# Patient Record
Sex: Female | Born: 1950 | Race: White | Hispanic: No | Marital: Single | Smoking: Current every day smoker
Health system: Southern US, Community
[De-identification: ages and names within clinical notes are randomized; demographics above are authoritative.]

## PROBLEM LIST (undated history)

## (undated) DIAGNOSIS — R112 Nausea with vomiting, unspecified: Secondary | ICD-10-CM

## (undated) DIAGNOSIS — F32A Depression, unspecified: Secondary | ICD-10-CM

## (undated) DIAGNOSIS — K219 Gastro-esophageal reflux disease without esophagitis: Secondary | ICD-10-CM

## (undated) DIAGNOSIS — D649 Anemia, unspecified: Secondary | ICD-10-CM

## (undated) DIAGNOSIS — G43909 Migraine, unspecified, not intractable, without status migrainosus: Secondary | ICD-10-CM

## (undated) DIAGNOSIS — R351 Nocturia: Secondary | ICD-10-CM

## (undated) DIAGNOSIS — M1990 Unspecified osteoarthritis, unspecified site: Secondary | ICD-10-CM

## (undated) DIAGNOSIS — Z9889 Other specified postprocedural states: Secondary | ICD-10-CM

## (undated) DIAGNOSIS — I10 Essential (primary) hypertension: Secondary | ICD-10-CM

## (undated) DIAGNOSIS — M4800 Spinal stenosis, site unspecified: Secondary | ICD-10-CM

## (undated) DIAGNOSIS — F329 Major depressive disorder, single episode, unspecified: Secondary | ICD-10-CM

## (undated) DIAGNOSIS — Z973 Presence of spectacles and contact lenses: Secondary | ICD-10-CM

## (undated) DIAGNOSIS — J189 Pneumonia, unspecified organism: Secondary | ICD-10-CM

## (undated) HISTORY — PX: COLONOSCOPY W/ BIOPSIES AND POLYPECTOMY: SHX1376

## (undated) HISTORY — PX: LUMBAR FUSION: SHX111

## (undated) HISTORY — PX: ECTOPIC PREGNANCY SURGERY: SHX613

## (undated) HISTORY — PX: DILATION AND CURETTAGE OF UTERUS: SHX78

## (undated) HISTORY — PX: HAMMER TOE SURGERY: SHX385

## (undated) HISTORY — PX: BUNIONECTOMY: SHX129

## (undated) HISTORY — PX: CARPAL TUNNEL RELEASE: SHX101

---

## 1999-07-07 HISTORY — PX: BACK SURGERY: SHX140

## 2009-09-04 ENCOUNTER — Encounter: Admission: RE | Admit: 2009-09-04 | Discharge: 2009-09-04 | Payer: Self-pay | Admitting: Gastroenterology

## 2010-05-09 ENCOUNTER — Emergency Department (HOSPITAL_BASED_OUTPATIENT_CLINIC_OR_DEPARTMENT_OTHER): Admission: EM | Admit: 2010-05-09 | Discharge: 2010-05-09 | Payer: Self-pay | Admitting: Emergency Medicine

## 2010-05-09 ENCOUNTER — Ambulatory Visit: Payer: Self-pay | Admitting: Diagnostic Radiology

## 2011-01-23 ENCOUNTER — Emergency Department (HOSPITAL_COMMUNITY): Payer: BC Managed Care – PPO

## 2011-01-23 ENCOUNTER — Emergency Department (HOSPITAL_COMMUNITY)
Admission: EM | Admit: 2011-01-23 | Discharge: 2011-01-24 | Disposition: A | Discharge status: Home / Self Care | Payer: BC Managed Care – PPO | Attending: Emergency Medicine | Admitting: Emergency Medicine

## 2011-01-23 DIAGNOSIS — J3489 Other specified disorders of nose and nasal sinuses: Secondary | ICD-10-CM | POA: Insufficient documentation

## 2011-01-23 DIAGNOSIS — R112 Nausea with vomiting, unspecified: Secondary | ICD-10-CM | POA: Insufficient documentation

## 2011-01-23 DIAGNOSIS — J329 Chronic sinusitis, unspecified: Secondary | ICD-10-CM | POA: Insufficient documentation

## 2011-01-23 DIAGNOSIS — R51 Headache: Secondary | ICD-10-CM | POA: Insufficient documentation

## 2011-01-23 DIAGNOSIS — R42 Dizziness and giddiness: Secondary | ICD-10-CM | POA: Insufficient documentation

## 2011-04-29 ENCOUNTER — Ambulatory Visit (HOSPITAL_COMMUNITY)
Admission: RE | Admit: 2011-04-29 | Discharge: 2011-04-29 | Disposition: A | Discharge status: Home / Self Care | Payer: BC Managed Care – PPO | Source: Ambulatory Visit | Attending: Family Medicine | Admitting: Family Medicine

## 2011-04-29 DIAGNOSIS — M79609 Pain in unspecified limb: Secondary | ICD-10-CM | POA: Insufficient documentation

## 2011-05-26 ENCOUNTER — Other Ambulatory Visit: Payer: Self-pay | Admitting: Neurosurgery

## 2011-05-26 DIAGNOSIS — M549 Dorsalgia, unspecified: Secondary | ICD-10-CM

## 2011-05-26 DIAGNOSIS — M5410 Radiculopathy, site unspecified: Secondary | ICD-10-CM

## 2011-06-02 ENCOUNTER — Ambulatory Visit
Admission: RE | Admit: 2011-06-02 | Discharge: 2011-06-02 | Disposition: A | Discharge status: Home / Self Care | Payer: BC Managed Care – PPO | Source: Ambulatory Visit | Attending: Neurosurgery | Admitting: Neurosurgery

## 2011-06-02 DIAGNOSIS — M5410 Radiculopathy, site unspecified: Secondary | ICD-10-CM

## 2011-06-02 DIAGNOSIS — M549 Dorsalgia, unspecified: Secondary | ICD-10-CM

## 2011-06-02 MED ORDER — ONDANSETRON HCL 4 MG/2ML IJ SOLN
4.0000 mg | Freq: Once | INTRAMUSCULAR | Status: AC
  Administered 2011-06-02: 4 mg via INTRAMUSCULAR

## 2011-06-02 MED ORDER — MEPERIDINE HCL 100 MG/ML IJ SOLN
75.0000 mg | Freq: Once | INTRAMUSCULAR | Status: AC
  Administered 2011-06-02: 75 mg via INTRAMUSCULAR

## 2011-06-02 MED ORDER — IOHEXOL 180 MG/ML  SOLN
20.0000 mL | Freq: Once | INTRAMUSCULAR | Status: AC | PRN
  Administered 2011-06-02: 20 mL via INTRATHECAL

## 2011-06-02 MED ORDER — DIAZEPAM 5 MG PO TABS
10.0000 mg | ORAL_TABLET | Freq: Once | ORAL | Status: AC
Start: 2011-06-02 — End: 2011-06-02
  Administered 2011-06-02: 10 mg via ORAL

## 2011-06-02 NOTE — Progress Notes (Signed)
Resting quietly in nursing station with friend at bedside.  "That pain shot is kicking in."  jkl

## 2011-06-02 NOTE — Patient Instructions (Signed)

## 2011-06-25 ENCOUNTER — Other Ambulatory Visit: Payer: Self-pay | Admitting: Neurosurgery

## 2011-07-01 ENCOUNTER — Encounter (HOSPITAL_COMMUNITY): Payer: Self-pay

## 2011-07-13 ENCOUNTER — Ambulatory Visit (HOSPITAL_COMMUNITY)
Admission: RE | Admit: 2011-07-13 | Discharge: 2011-07-13 | Disposition: A | Discharge status: Home / Self Care | Payer: BC Managed Care – PPO | Source: Ambulatory Visit | Attending: Anesthesiology | Admitting: Anesthesiology

## 2011-07-13 ENCOUNTER — Encounter (HOSPITAL_COMMUNITY)
Admission: RE | Admit: 2011-07-13 | Discharge: 2011-07-13 | Disposition: A | Discharge status: Home / Self Care | Payer: BC Managed Care – PPO | Source: Ambulatory Visit | Attending: Neurosurgery | Admitting: Neurosurgery

## 2011-07-13 ENCOUNTER — Encounter (HOSPITAL_COMMUNITY): Payer: Self-pay

## 2011-07-13 ENCOUNTER — Other Ambulatory Visit: Payer: Self-pay

## 2011-07-13 DIAGNOSIS — Z01818 Encounter for other preprocedural examination: Secondary | ICD-10-CM | POA: Insufficient documentation

## 2011-07-13 DIAGNOSIS — M48061 Spinal stenosis, lumbar region without neurogenic claudication: Secondary | ICD-10-CM | POA: Insufficient documentation

## 2011-07-13 DIAGNOSIS — Z01812 Encounter for preprocedural laboratory examination: Secondary | ICD-10-CM | POA: Insufficient documentation

## 2011-07-13 HISTORY — DX: Migraine, unspecified, not intractable, without status migrainosus: G43.909

## 2011-07-13 HISTORY — DX: Essential (primary) hypertension: I10

## 2011-07-13 HISTORY — DX: Spinal stenosis, site unspecified: M48.00

## 2011-07-13 HISTORY — DX: Major depressive disorder, single episode, unspecified: F32.9

## 2011-07-13 HISTORY — DX: Nocturia: R35.1

## 2011-07-13 HISTORY — DX: Depression, unspecified: F32.A

## 2011-07-13 HISTORY — DX: Unspecified osteoarthritis, unspecified site: M19.90

## 2011-07-13 LAB — COMPREHENSIVE METABOLIC PANEL
AST: 22 U/L (ref 0–37)
Albumin: 4.4 g/dL (ref 3.5–5.2)
CO2: 27 mEq/L (ref 19–32)
Calcium: 10.3 mg/dL (ref 8.4–10.5)
Creatinine, Ser: 0.71 mg/dL (ref 0.50–1.10)
GFR calc non Af Amer: >90 mL/min (ref >90)

## 2011-07-13 LAB — CBC
Hemoglobin: 14 g/dL (ref 12.0–15.0)
MCH: 33.1 pg (ref 26.0–34.0)
RBC: 4.23 MIL/uL (ref 3.87–5.11)
WBC: 8.4 10*3/uL (ref 4.0–10.5)

## 2011-07-13 LAB — SURGICAL PCR SCREEN
MRSA, PCR: NEGATIVE
Staphylococcus aureus: NEGATIVE

## 2011-07-13 NOTE — Pre-Procedure Instructions (Signed)
20 DEBAR PLATE  07/13/2011   Your procedure is scheduled on:  July 15, 2011  Report to [facility] at 0700 AM.  Call this number if you have problems the morning of surgery: [phone]   Remember:   Do not eat food:After Midnight.  May have clear liquids: up to 4 Hours before arrival.  Clear liquids include soda, tea, black coffee, apple or grape juice, broth.  Take these medicines the morning of surgery with A SIP OF WATER: Zyrtec, eye drop, Effexor   Do not wear jewelry, make-up or nail polish.  Do not wear lotions, powders, or perfumes. You may wear deodorant.  Do not shave 48 hours prior to surgery.  Do not bring valuables to the hospital.  Contacts, dentures or bridgework may not be worn into surgery.  Leave suitcase in the car. After surgery it may be brought to your room.  For patients admitted to the hospital, checkout time is 11:00 AM the day of discharge.   Patients discharged the day of surgery will not be allowed to drive home.  Special Instructions: CHG Shower Use Special Wash: 1/2 bottle night before surgery and 1/2 bottle morning of surgery.   Please read over the following fact sheets that you were given: Pain Booklet, Coughing and Deep Breathing and Surgical Site Infection Prevention

## 2011-07-14 MED ORDER — CEFAZOLIN SODIUM 1-5 GM-% IV SOLN
1.0000 g | INTRAVENOUS | Status: AC
  Administered 2011-07-15: 1 g via INTRAVENOUS
  Filled 2011-07-14: qty 50

## 2011-07-15 ENCOUNTER — Encounter (HOSPITAL_COMMUNITY)
Admission: RE | Disposition: A | Discharge status: Home / Self Care | Payer: Self-pay | Source: Ambulatory Visit | Attending: Neurosurgery

## 2011-07-15 ENCOUNTER — Encounter (HOSPITAL_COMMUNITY): Payer: Self-pay | Admitting: *Deleted

## 2011-07-15 ENCOUNTER — Ambulatory Visit (HOSPITAL_COMMUNITY)
Admission: RE | Admit: 2011-07-15 | Discharge: 2011-07-17 | DRG: 758 | Disposition: A | Discharge status: Home / Self Care | Payer: BC Managed Care – PPO | Source: Ambulatory Visit | Attending: Neurosurgery | Admitting: Neurosurgery

## 2011-07-15 ENCOUNTER — Ambulatory Visit (HOSPITAL_COMMUNITY): Payer: BC Managed Care – PPO | Admitting: Anesthesiology

## 2011-07-15 ENCOUNTER — Encounter (HOSPITAL_COMMUNITY): Payer: Self-pay | Admitting: Anesthesiology

## 2011-07-15 ENCOUNTER — Ambulatory Visit (HOSPITAL_COMMUNITY): Payer: BC Managed Care – PPO

## 2011-07-15 DIAGNOSIS — F17200 Nicotine dependence, unspecified, uncomplicated: Secondary | ICD-10-CM | POA: Insufficient documentation

## 2011-07-15 DIAGNOSIS — Z981 Arthrodesis status: Secondary | ICD-10-CM | POA: Insufficient documentation

## 2011-07-15 DIAGNOSIS — M48061 Spinal stenosis, lumbar region without neurogenic claudication: Secondary | ICD-10-CM | POA: Insufficient documentation

## 2011-07-15 DIAGNOSIS — R351 Nocturia: Secondary | ICD-10-CM | POA: Insufficient documentation

## 2011-07-15 DIAGNOSIS — R51 Headache: Secondary | ICD-10-CM | POA: Insufficient documentation

## 2011-07-15 DIAGNOSIS — F3289 Other specified depressive episodes: Secondary | ICD-10-CM | POA: Insufficient documentation

## 2011-07-15 DIAGNOSIS — F329 Major depressive disorder, single episode, unspecified: Secondary | ICD-10-CM | POA: Insufficient documentation

## 2011-07-15 DIAGNOSIS — I10 Essential (primary) hypertension: Secondary | ICD-10-CM | POA: Insufficient documentation

## 2011-07-15 DIAGNOSIS — K649 Unspecified hemorrhoids: Secondary | ICD-10-CM | POA: Insufficient documentation

## 2011-07-15 HISTORY — PX: LUMBAR LAMINECTOMY/DECOMPRESSION MICRODISCECTOMY: SHX5026

## 2011-07-15 SURGERY — LUMBAR LAMINECTOMY/DECOMPRESSION MICRODISCECTOMY
Anesthesia: General | Site: Back | Wound class: Clean

## 2011-07-15 MED ORDER — CEFAZOLIN SODIUM 1-5 GM-% IV SOLN
1.0000 g | Freq: Three times a day (TID) | INTRAVENOUS | Status: AC
  Administered 2011-07-15 – 2011-07-16 (×2): 1 g via INTRAVENOUS
  Filled 2011-07-15 (×3): qty 50

## 2011-07-15 MED ORDER — SODIUM CHLORIDE 0.9 % IJ SOLN: 3.0000 mL | INTRAMUSCULAR | Status: DC | PRN

## 2011-07-15 MED ORDER — PROPOFOL 10 MG/ML IV EMUL
INTRAVENOUS | Status: DC | PRN
  Administered 2011-07-15: 200 mg via INTRAVENOUS

## 2011-07-15 MED ORDER — FENTANYL CITRATE 0.05 MG/ML IJ SOLN
INTRAMUSCULAR | Status: DC | PRN
  Administered 2011-07-15 (×2): 50 ug via INTRAVENOUS
  Administered 2011-07-15: 100 ug via INTRAVENOUS
  Administered 2011-07-15 (×2): 50 ug via INTRAVENOUS

## 2011-07-15 MED ORDER — LORATADINE 10 MG PO TABS
10.0000 mg | ORAL_TABLET | Freq: Every day | ORAL | Status: DC
  Administered 2011-07-16 – 2011-07-17 (×2): 10 mg via ORAL
  Filled 2011-07-15 (×3): qty 1

## 2011-07-15 MED ORDER — SODIUM CHLORIDE 0.9 % IV SOLN: 250.0000 mL | INTRAVENOUS | Status: DC

## 2011-07-15 MED ORDER — EPHEDRINE SULFATE 50 MG/ML IJ SOLN
INTRAMUSCULAR | Status: DC | PRN
  Administered 2011-07-15: 10 mg via INTRAVENOUS
  Administered 2011-07-15 (×2): 5 mg via INTRAVENOUS
  Administered 2011-07-15 (×2): 10 mg via INTRAVENOUS

## 2011-07-15 MED ORDER — METOPROLOL SUCCINATE ER 50 MG PO TB24
50.0000 mg | ORAL_TABLET | Freq: Every day | ORAL | Status: DC
  Administered 2011-07-15 – 2011-07-17 (×3): 50 mg via ORAL
  Filled 2011-07-15 (×3): qty 1

## 2011-07-15 MED ORDER — ZOLPIDEM TARTRATE 5 MG PO TABS: 10.0000 mg | ORAL_TABLET | Freq: Every evening | ORAL | Status: DC | PRN

## 2011-07-15 MED ORDER — MIDAZOLAM HCL 5 MG/5ML IJ SOLN
INTRAMUSCULAR | Status: DC | PRN
  Administered 2011-07-15: 2 mg via INTRAVENOUS

## 2011-07-15 MED ORDER — THROMBIN 5000 UNITS EX SOLR
OROMUCOSAL | Status: DC | PRN
  Administered 2011-07-15: 10:00:00 via TOPICAL

## 2011-07-15 MED ORDER — ROCURONIUM BROMIDE 100 MG/10ML IV SOLN
INTRAVENOUS | Status: DC | PRN
  Administered 2011-07-15: 10 mg via INTRAVENOUS
  Administered 2011-07-15: 40 mg via INTRAVENOUS

## 2011-07-15 MED ORDER — DIAZEPAM 5 MG/ML IJ SOLN
INTRAMUSCULAR | Status: AC
  Administered 2011-07-15: 5 mg via INTRAVENOUS
  Filled 2011-07-15: qty 2

## 2011-07-15 MED ORDER — DIAZEPAM 5 MG/ML PO CONC: 5.0000 mg | Freq: Four times a day (QID) | ORAL | Status: DC | PRN

## 2011-07-15 MED ORDER — LIDOCAINE HCL (CARDIAC) 20 MG/ML IV SOLN
INTRAVENOUS | Status: DC | PRN
  Administered 2011-07-15: 100 mg via INTRAVENOUS

## 2011-07-15 MED ORDER — VENLAFAXINE HCL ER 37.5 MG PO CP24
37.5000 mg | ORAL_CAPSULE | Freq: Every day | ORAL | Status: DC
  Administered 2011-07-16 – 2011-07-17 (×2): 37.5 mg via ORAL
  Filled 2011-07-15 (×3): qty 1

## 2011-07-15 MED ORDER — SODIUM CHLORIDE 0.9 % IJ SOLN
3.0000 mL | Freq: Two times a day (BID) | INTRAMUSCULAR | Status: DC
  Administered 2011-07-15: 3 mL via INTRAVENOUS

## 2011-07-15 MED ORDER — GLYCOPYRROLATE 0.2 MG/ML IJ SOLN
INTRAMUSCULAR | Status: DC | PRN
  Administered 2011-07-15: .4 mg via INTRAVENOUS

## 2011-07-15 MED ORDER — MENTHOL 3 MG MT LOZG
1.0000 | LOZENGE | OROMUCOSAL | Status: DC | PRN
  Filled 2011-07-15: qty 9

## 2011-07-15 MED ORDER — SODIUM CHLORIDE 0.9 % IV SOLN
INTRAVENOUS | Status: DC
  Administered 2011-07-15: 1000 mL via INTRAVENOUS

## 2011-07-15 MED ORDER — PHENOL 1.4 % MT LIQD: 1.0000 | OROMUCOSAL | Status: DC | PRN

## 2011-07-15 MED ORDER — MORPHINE SULFATE 4 MG/ML IJ SOLN
INTRAMUSCULAR | Status: AC
  Filled 2011-07-15: qty 1

## 2011-07-15 MED ORDER — LACTATED RINGERS IV SOLN
INTRAVENOUS | Status: DC | PRN
  Administered 2011-07-15 (×2): via INTRAVENOUS

## 2011-07-15 MED ORDER — BACITRACIN 50000 UNITS IM SOLR
INTRAMUSCULAR | Status: AC
  Filled 2011-07-15: qty 50000

## 2011-07-15 MED ORDER — HYDROMORPHONE HCL PF 1 MG/ML IJ SOLN
0.2500 mg | INTRAMUSCULAR | Status: DC | PRN
  Administered 2011-07-15 (×2): 0.5 mg via INTRAVENOUS

## 2011-07-15 MED ORDER — DROPERIDOL 2.5 MG/ML IJ SOLN: 0.6250 mg | INTRAMUSCULAR | Status: DC | PRN

## 2011-07-15 MED ORDER — ONDANSETRON HCL 4 MG/2ML IJ SOLN: 4.0000 mg | INTRAMUSCULAR | Status: DC | PRN

## 2011-07-15 MED ORDER — ONDANSETRON HCL 4 MG/2ML IJ SOLN
INTRAMUSCULAR | Status: DC | PRN
  Administered 2011-07-15: 4 mg via INTRAVENOUS

## 2011-07-15 MED ORDER — HYDROMORPHONE HCL PF 1 MG/ML IJ SOLN
INTRAMUSCULAR | Status: AC
  Filled 2011-07-15: qty 1

## 2011-07-15 MED ORDER — NEOSTIGMINE METHYLSULFATE 1 MG/ML IJ SOLN
INTRAMUSCULAR | Status: DC | PRN
  Administered 2011-07-15: 3 mg via INTRAVENOUS

## 2011-07-15 MED ORDER — ACETAMINOPHEN 650 MG RE SUPP: 650.0000 mg | RECTAL | Status: DC | PRN

## 2011-07-15 MED ORDER — SODIUM CHLORIDE 0.9 % IV SOLN
INTRAVENOUS | Status: AC
  Filled 2011-07-15: qty 500

## 2011-07-15 MED ORDER — OXYCODONE-ACETAMINOPHEN 5-325 MG PO TABS
1.0000 | ORAL_TABLET | ORAL | Status: DC | PRN
  Administered 2011-07-15 – 2011-07-16 (×7): 2 via ORAL
  Administered 2011-07-16: 1 via ORAL
  Administered 2011-07-17 (×2): 2 via ORAL
  Filled 2011-07-15 (×10): qty 2

## 2011-07-15 MED ORDER — BUPIVACAINE-EPINEPHRINE PF 0.5-1:200000 % IJ SOLN
INTRAMUSCULAR | Status: DC | PRN
  Administered 2011-07-15: 30 mL

## 2011-07-15 MED ORDER — ACETAMINOPHEN 325 MG PO TABS: 650.0000 mg | ORAL_TABLET | ORAL | Status: DC | PRN

## 2011-07-15 MED ORDER — DIAZEPAM 5 MG PO TABS
5.0000 mg | ORAL_TABLET | Freq: Four times a day (QID) | ORAL | Status: DC | PRN
  Administered 2011-07-15 – 2011-07-17 (×7): 5 mg via ORAL
  Filled 2011-07-15 (×7): qty 1

## 2011-07-15 MED ORDER — MORPHINE SULFATE 4 MG/ML IJ SOLN: 1.0000 mg | INTRAMUSCULAR | Status: DC | PRN

## 2011-07-15 SURGICAL SUPPLY — 60 items
BENZOIN TINCTURE PRP APPL 2/3 (GAUZE/BANDAGES/DRESSINGS) ×2 IMPLANT
BLADE SURG ROTATE 9660 (MISCELLANEOUS) IMPLANT
BUR ACORN 6.0 (BURR) IMPLANT
BUR MATCHSTICK NEURO 3.0 LAGG (BURR) ×2 IMPLANT
CANISTER SUCTION 2500CC (MISCELLANEOUS) ×2 IMPLANT
CLOTH BEACON ORANGE TIMEOUT ST (SAFETY) ×2 IMPLANT
CONT SPEC 4OZ CLIKSEAL STRL BL (MISCELLANEOUS) ×2 IMPLANT
DRAPE LAPAROTOMY 100X72X124 (DRAPES) ×2 IMPLANT
DRAPE MICROSCOPE LEICA (MISCELLANEOUS) ×2 IMPLANT
DRAPE POUCH INSTRU U-SHP 10X18 (DRAPES) ×2 IMPLANT
DRSG PAD ABDOMINAL 8X10 ST (GAUZE/BANDAGES/DRESSINGS) IMPLANT
DURAPREP 26ML APPLICATOR (WOUND CARE) ×2 IMPLANT
DURASEAL ×2 IMPLANT
DURASEAL SPINE SEALANT 3ML (MISCELLANEOUS) ×2 IMPLANT
ELECT BLADE 4.0 EZ CLEAN MEGAD (MISCELLANEOUS) ×2
ELECT REM PT RETURN 9FT ADLT (ELECTROSURGICAL) ×2
ELECTRODE BLDE 4.0 EZ CLN MEGD (MISCELLANEOUS) ×1 IMPLANT
ELECTRODE REM PT RTRN 9FT ADLT (ELECTROSURGICAL) ×1 IMPLANT
GAUZE SPONGE 4X4 16PLY XRAY LF (GAUZE/BANDAGES/DRESSINGS) IMPLANT
GLOVE BIOGEL M 8.0 STRL (GLOVE) ×2 IMPLANT
GLOVE BIOGEL PI IND STRL 7.0 (GLOVE) ×1 IMPLANT
GLOVE BIOGEL PI IND STRL 7.5 (GLOVE) ×1 IMPLANT
GLOVE BIOGEL PI IND STRL 8 (GLOVE) ×3 IMPLANT
GLOVE BIOGEL PI INDICATOR 7.0 (GLOVE) ×1
GLOVE BIOGEL PI INDICATOR 7.5 (GLOVE) ×1
GLOVE BIOGEL PI INDICATOR 8 (GLOVE) ×3
GLOVE ECLIPSE 7.5 STRL STRAW (GLOVE) ×10 IMPLANT
GLOVE EXAM NITRILE LRG STRL (GLOVE) IMPLANT
GLOVE EXAM NITRILE MD LF STRL (GLOVE) IMPLANT
GLOVE EXAM NITRILE XL STR (GLOVE) IMPLANT
GLOVE EXAM NITRILE XS STR PU (GLOVE) IMPLANT
GOWN BRE IMP SLV AUR LG STRL (GOWN DISPOSABLE) ×4 IMPLANT
GOWN BRE IMP SLV AUR XL STRL (GOWN DISPOSABLE) ×6 IMPLANT
GOWN STRL REIN 2XL LVL4 (GOWN DISPOSABLE) IMPLANT
KIT BASIN OR (CUSTOM PROCEDURE TRAY) ×2 IMPLANT
KIT ROOM TURNOVER OR (KITS) ×2 IMPLANT
NEEDLE HYPO 18GX1.5 BLUNT FILL (NEEDLE) IMPLANT
NEEDLE HYPO 21X1.5 SAFETY (NEEDLE) IMPLANT
NEEDLE HYPO 25X1 1.5 SAFETY (NEEDLE) IMPLANT
NEEDLE SPNL 20GX3.5 QUINCKE YW (NEEDLE) IMPLANT
NS IRRIG 1000ML POUR BTL (IV SOLUTION) ×2 IMPLANT
PACK LAMINECTOMY NEURO (CUSTOM PROCEDURE TRAY) ×2 IMPLANT
PAD ARMBOARD 7.5X6 YLW CONV (MISCELLANEOUS) ×6 IMPLANT
PATTIES SURGICAL .5 X1 (DISPOSABLE) ×2 IMPLANT
RUBBERBAND STERILE (MISCELLANEOUS) ×4 IMPLANT
SPONGE GAUZE 4X4 12PLY (GAUZE/BANDAGES/DRESSINGS) ×2 IMPLANT
SPONGE LAP 4X18 X RAY DECT (DISPOSABLE) IMPLANT
SPONGE SURGIFOAM ABS GEL SZ50 (HEMOSTASIS) ×2 IMPLANT
STRIP CLOSURE SKIN 1/2X4 (GAUZE/BANDAGES/DRESSINGS) ×2 IMPLANT
SUT VIC AB 0 CT1 18XCR BRD8 (SUTURE) ×1 IMPLANT
SUT VIC AB 0 CT1 8-18 (SUTURE) ×1
SUT VIC AB 2-0 CP2 18 (SUTURE) ×2 IMPLANT
SUT VIC AB 3-0 SH 8-18 (SUTURE) ×2 IMPLANT
SYR 20CC LL (SYRINGE) IMPLANT
SYR 20ML ECCENTRIC (SYRINGE) ×2 IMPLANT
SYR 5ML LL (SYRINGE) IMPLANT
TAPE CLOTH SURG 4X10 WHT LF (GAUZE/BANDAGES/DRESSINGS) ×2 IMPLANT
TOWEL OR 17X24 6PK STRL BLUE (TOWEL DISPOSABLE) ×2 IMPLANT
TOWEL OR 17X26 10 PK STRL BLUE (TOWEL DISPOSABLE) ×2 IMPLANT
WATER STERILE IRR 1000ML POUR (IV SOLUTION) ×2 IMPLANT

## 2011-07-15 NOTE — Transfer of Care (Signed)
Immediate Anesthesia Transfer of Care Note  Patient: Nicole Melton  Procedure(s) Performed:  LUMBAR LAMINECTOMY/DECOMPRESSION MICRODISCECTOMY - Lumbar three - four Laminectomy  Patient Location: PACU  Anesthesia Type: General  Level of Consciousness: awake, alert  and oriented  Airway & Oxygen Therapy: Patient Spontanous Breathing and Patient connected to nasal cannula oxygen  Post-op Assessment: Report given to PACU RN, Post -op Vital signs reviewed and stable and Patient moving all extremities X 4  Post vital signs: Reviewed and stable  Complications: No apparent anesthesia complications

## 2011-07-15 NOTE — H&P (Signed)
Nicole Melton is an 61 y.o. female.   Chief Complaint: lumbar pain HPI: lumbar pain with radiation to lower exytremities.had lumbar fusion at l4 5 many years ago.   Past Medical History  Diagnosis Date  . Hypertension   . Spinal stenosis   . Migraines   . Arthritis   . Hemorrhoids   . Nocturia   . Depression     Past Surgical History  Procedure Date  . Back surgery 2001  . Bunionectomy   . Hammer toe surgery   . Cesarean section     x3  . Carpal tunnel release     bilateral  . Lumbar fusion     Family History  Problem Relation Age of Onset  . Anesthesia problems Neg Hx    Social History:  reports that she has been smoking.  She has never used smokeless tobacco. She reports that she drinks about 4.2 ounces of alcohol per week. She reports that she does not use illicit drugs.  Allergies: No Known Allergies  Medications Prior to Admission  Medication Dose Route Frequency Provider Last Rate Last Dose  . ceFAZolin (ANCEF) IVPB 1 g/50 mL premix  1 g Intravenous 60 min Pre-Op Tanya Nones Amily Depp      . DISCONTD: bacitracin 82956 UNITS injection           . DISCONTD: sodium chloride 0.9 % infusion            No current outpatient prescriptions on file as of 07/15/2011.    Results for orders placed during the hospital encounter of 07/13/11 (from the past 48 hour(s))  SURGICAL PCR SCREEN     Status: Normal   Collection Time   07/13/11  2:17 PM      Component Value Range Comment   MRSA, PCR NEGATIVE  NEGATIVE     Staphylococcus aureus NEGATIVE  NEGATIVE    COMPREHENSIVE METABOLIC PANEL     Status: Abnormal   Collection Time   07/13/11  2:19 PM      Component Value Range Comment   Sodium 139  135 - 145 (mEq/L)    Potassium 4.4  3.5 - 5.1 (mEq/L)    Chloride 102  96 - 112 (mEq/L)    CO2 27  19 - 32 (mEq/L)    Glucose, Bld 117 (*) 70 - 99 (mg/dL)    BUN 14  6 - 23 (mg/dL)    Creatinine, Ser 2.13  0.50 - 1.10 (mg/dL)    Calcium 08.6  8.4 - 10.5 (mg/dL)    Total Protein 7.4   6.0 - 8.3 (g/dL)    Albumin 4.4  3.5 - 5.2 (g/dL)    AST 22  0 - 37 (U/L)    ALT 24  0 - 35 (U/L)    Alkaline Phosphatase 69  39 - 117 (U/L)    Total Bilirubin 0.2 (*) 0.3 - 1.2 (mg/dL)    GFR calc non Af Amer >90  >90 (mL/min)    GFR calc Af Amer >90  >90 (mL/min)   CBC     Status: Normal   Collection Time   07/13/11  2:19 PM      Component Value Range Comment   WBC 8.4  4.0 - 10.5 (K/uL)    RBC 4.23  3.87 - 5.11 (MIL/uL)    Hemoglobin 14.0  12.0 - 15.0 (g/dL)    HCT 57.8  46.9 - 62.9 (%)    MCV 96.9  78.0 - 100.0 (fL)  MCH 33.1  26.0 - 34.0 (pg)    MCHC 34.1  30.0 - 36.0 (g/dL)    RDW 16.1  09.6 - 04.5 (%)    Platelets 362  150 - 400 (K/uL)    Dg Chest 2 View  07/13/2011  *RADIOLOGY REPORT*  Clinical Data: Preop lumbar surgery.  Lumbar stenosis.  CHEST - 2 VIEW  Comparison: None.  Findings: Heart and mediastinal contours are within normal limits. No focal opacities or effusions.  No acute bony abnormality.  Old left-sided rib fractures.  IMPRESSION: No active cardiopulmonary disease.  Original Report Authenticated By: Cyndie Chime, M.D.    Review of Systems  Constitutional: Negative.   HENT: Negative.   Eyes: Negative.   Respiratory: Negative.   Cardiovascular: Negative.   Gastrointestinal: Negative.   Genitourinary: Negative.   Musculoskeletal: Positive for back pain.  Skin: Negative.   Neurological: Positive for focal weakness.  Endo/Heme/Allergies: Negative.   Psychiatric/Behavioral: Negative.     Blood pressure 145/87, pulse 64, temperature 98.4 F (36.9 C), temperature source Oral, resp. rate 18, SpO2 97.00%. Physical Exam heent, nl. Neck,nl. Lungs, nl. Cv, nl. Abdomen,soft. NEURO  Mild weakness of both df. Dtr, nl. slr positive bilaterally.  Assessment/Planmyelogram of lumbar spine, .fusion at l4 5 with spondylolisthesis with no movement, but at l3 4 she has a severe stenosis Plan  decompressive laminecgtomies at l3 4.  Noely Kuhnle M 07/15/2011, 9:03  AM

## 2011-07-15 NOTE — Anesthesia Procedure Notes (Signed)
Procedure Name: Intubation Date/Time: 07/15/2011 9:23 AM Performed by: Rosita Fire Pre-anesthesia Checklist: Patient identified, Emergency Drugs available, Suction available and Patient being monitored Patient Re-evaluated:Patient Re-evaluated prior to inductionOxygen Delivery Method: Circle System Utilized Preoxygenation: Pre-oxygenation with 100% oxygen Intubation Type: IV induction Ventilation: Mask ventilation without difficulty Laryngoscope Size: Miller and 2 Grade View: Grade II Tube type: Oral Tube size: 7.5 mm Number of attempts: 1 Placement Confirmation: ETT inserted through vocal cords under direct vision,  breath sounds checked- equal and bilateral and positive ETCO2 Secured at: 21 cm Tube secured with: Tape Dental Injury: Teeth and Oropharynx as per pre-operative assessment

## 2011-07-15 NOTE — Preoperative (Signed)
Beta Blockers   Reason not to administer Beta Blockers:Not Applicable 

## 2011-07-15 NOTE — Progress Notes (Signed)
l3 4 decompression under microscope done. Op note number [id number]

## 2011-07-15 NOTE — Anesthesia Preprocedure Evaluation (Addendum)
Anesthesia Evaluation  Patient identified by MRN, date of birth, ID band Patient awake    Reviewed: Allergy & Precautions, H&P , NPO status , Patient's Chart, lab work & pertinent test results  Airway Mallampati: II TM Distance: >3 FB     Dental  (+) Teeth Intact   Pulmonary Current Smoker,  clear to auscultation  Pulmonary exam normal       Cardiovascular hypertension, Pt. on home beta blockers Regular Normal- Systolic murmurs    Neuro/Psych  Headaches, PSYCHIATRIC DISORDERS    GI/Hepatic   Endo/Other    Renal/GU      Musculoskeletal   Abdominal   Peds  Hematology   Anesthesia Other Findings   Reproductive/Obstetrics                         Anesthesia Physical Anesthesia Plan  ASA: II  Anesthesia Plan: General   Post-op Pain Management:    Induction: Intravenous  Airway Management Planned: Oral ETT  Additional Equipment:   Intra-op Plan:   Post-operative Plan: Extubation in OR  Informed Consent: I have reviewed the patients History and Physical, chart, labs and discussed the procedure including the risks, benefits and alternatives for the proposed anesthesia with the patient or authorized representative who has indicated his/her understanding and acceptance.   Dental advisory given  Plan Discussed with:   Anesthesia Plan Comments:         Anesthesia Quick Evaluation

## 2011-07-15 NOTE — Anesthesia Postprocedure Evaluation (Signed)
Anesthesia Post Note  Patient: Nicole Melton  Procedure(s) Performed:  LUMBAR LAMINECTOMY/DECOMPRESSION MICRODISCECTOMY - Lumbar three - four Laminectomy  Anesthesia type: general  Patient location: PACU  Post pain: Pain level controlled  Post assessment: Patient's Cardiovascular Status Stable  Last Vitals:  Filed Vitals:   07/15/11 1314  BP: 128/77  Pulse: 65  Temp: 36.6 C  Resp: 18    Post vital signs: Reviewed and stable  Level of consciousness: sedated  Complications: No apparent anesthesia complications

## 2011-07-16 ENCOUNTER — Encounter (HOSPITAL_COMMUNITY): Payer: Self-pay | Admitting: Neurosurgery

## 2011-07-16 NOTE — Progress Notes (Signed)
Patient ID: Nicole Melton, female   DOB: [birth year], 61 y.o.   MRN: [id number] C/o muscle spasm. No weakness. Sensory normal the patient/ot to see her

## 2011-07-16 NOTE — Progress Notes (Signed)
Physical Therapy Evaluation/Discharge Patient Details Name: Nicole Melton MRN: [id number] DOB: Jun 17, 1951 Today's Date: 07/16/2011  Problem List: There is no problem list on file for this patient.   Past Medical History:  Past Medical History  Diagnosis Date  . Hypertension   . Spinal stenosis   . Migraines   . Arthritis   . Hemorrhoids   . Nocturia   . Depression    Past Surgical History:  Past Surgical History  Procedure Date  . Back surgery 2001  . Bunionectomy   . Hammer toe surgery   . Cesarean section     x3  . Carpal tunnel release     bilateral  . Lumbar fusion   . Lumbar laminectomy/decompression microdiscectomy 07/15/2011    Procedure: LUMBAR LAMINECTOMY/DECOMPRESSION MICRODISCECTOMY;  Surgeon: Karn Cassis;  Location: [facility];  Service: Neurosurgery;  Laterality: N/A;  Lumbar three - four Laminectomy    PT Assessment/Plan/Recommendation PT Assessment Clinical Impression Statement: Pt. is a 61 y/o female admitted s/p L3-4 decompression.  Pt. is at least supervision for all mobility and all acute PT needs addressed.  Pt. with increased pain during session, and RN notified. PT Recommendation/Assessment: All further PT needs can be met in the next venue of care PT Problem List: Decreased activity tolerance;Decreased mobility;Decreased knowledge of precautions;Pain PT Therapy Diagnosis : Hemiplegia non-dominant side;Difficulty walking PT Recommendation Follow Up Recommendations: Home health PT;Supervision/Assistance - 24 hour Equipment Recommended: 3 in 1 bedside comode PT Goals  Acute Rehab PT Goals PT Goal Formulation:  (will defer to HHPT)  PT Evaluation Precautions/Restrictions  Precautions Precautions: Back Precaution Booklet Issued: Yes (comment) (handout given to pt.) Required Braces or Orthoses: No Restrictions Weight Bearing Restrictions: No Prior Functioning  Home Living Lives With: Alone Receives Help From: Friend(s) Type of Home:  House Home Layout: One level Home Access: Stairs to enter Entrance Stairs-Rails: None (has wall on left) Entrance Stairs-Number of Steps: 2 Bathroom Shower/Tub: Health visitor: Standard Bathroom Accessibility: Yes How Accessible: Accessible via walker Home Adaptive Equipment: None Prior Function Level of Independence: Independent with basic ADLs;Independent with homemaking with ambulation;Independent with gait;Independent with transfers Able to Take Stairs?: Yes Driving: Yes Leisure: Hobbies-no Cognition Cognition Arousal/Alertness: Awake/alert Overall Cognitive Status: Appears within functional limits for tasks assessed Orientation Level: Oriented X4 Sensation/Coordination Sensation Light Touch: Appears Intact Stereognosis: Not tested Hot/Cold: Not tested Proprioception: Not tested Coordination Gross Motor Movements are Fluid and Coordinated: Yes Fine Motor Movements are Fluid and Coordinated: Yes Extremity Assessment RUE Assessment RUE Assessment: Not tested LUE Assessment LUE Assessment: Not tested RLE Assessment RLE Assessment: Within Functional Limits LLE Assessment LLE Assessment: Within Functional Limits Mobility (including Balance) Bed Mobility Bed Mobility: Yes Rolling Right: 7: Independent Rolling Left: 7: Independent Right Sidelying to Sit: 7: Independent Sit to Sidelying Right: 7: Independent Transfers Transfers: Yes Sit to Stand: 5: Supervision;From bed;From toilet;With upper extremity assist Sit to Stand Details (indicate cue type and reason): increased time needed due to pain Stand to Sit: 5: Supervision;With upper extremity assist;To toilet;To bed Ambulation/Gait Ambulation/Gait: Yes Ambulation/Gait Assistance: 5: Supervision Ambulation/Gait Assistance Details (indicate cue type and reason): decreased speed, very guarded posture due to pain Ambulation Distance (Feet): 225 Feet Assistive device: None Gait Pattern: Lateral trunk  lean to right Stairs: Yes Stairs Assistance: 4: Min assist (minguard A) Stair Management Technique: Alternating pattern;One rail Left Number of Stairs: 2  Height of Stairs: 6  Wheelchair Mobility Wheelchair Mobility: No  Posture/Postural Control Posture/Postural Control: No significant limitations Balance  Balance Assessed: No Exercise    End of Session PT - End of Session Equipment Utilized During Treatment: Gait belt Activity Tolerance: Patient tolerated treatment well;Patient limited by pain Patient left: in bed;with family/visitor present;with call bell in reach Nurse Communication: Mobility status for transfers;Mobility status for ambulation General Behavior During Session: [facility] for tasks performed Cognition: [facility] for tasks performed  Feltis, Nicki Reaper 07/16/2011, 2:23 PM

## 2011-07-16 NOTE — Op Note (Signed)
NAMEMARIEKE, LUBKE NO.:  [id number]  MEDICAL RECORD NO.:  [id number]  LOCATION:  [facility]                         FACILITY:  [facility]  PHYSICIAN:  Hilda Lias, M.D.   DATE OF BIRTH:  1950-11-20  DATE OF PROCEDURE:  07/15/2011 DATE OF DISCHARGE:                              OPERATIVE REPORT   PREOPERATIVE DIAGNOSES:  L3-4 stenosis.  Status post L4-5 fusion. Radiculopathy.  POSTOPERATIVE DIAGNOSES:  L3-4 stenosis.  Status post L4-5 fusion. Radiculopathy.  PROCEDURE:  Bilateral L3 laminectomy and removal of scar tissue at the level of L3-L4, decompression of the thecal sac.  Foraminotomy. Microscope.  SURGEON:  Hilda Lias, MD  ASSISTANT:  Hewitt Shorts, MD  CLINICAL HISTORY:  The patient is a 61 year old female who many years ago underwent fusion at the level L4-5 in [place].  Lately, the patient has been complaining of back pain worse to both legs.  We did a myelogram which showed that she has some severe stenosis at the level of L3-4.  The area where she had the surgery is widely decompressed.  The patient has failed conservative treatment and surgery was advised.  The patient knew the risk of surgery including the possibility of CSF leak, infection, spondylolisthesis.  PROCEDURE:  The patient was taken to the OR.  After intubation, she was positioned in a prone manner.  The skin was cleaned with DuraPrep. Drapes were applied.  Midline incision following the previous one in the upper part was made through the skin, subcutaneous tissue straight down to the lumbar spine.  X-rays showed that indeed the clip was at the level of L3.  Then, we proceeded to remove the spinous process of L3 and we did a wide laminectomy using the Kerrison punch, as well as the drill.  We brought the microscope into the area.  Decompression of the level of L3 was easy but being at the junction of L3-L4 we found quite a bit of thickening of the yellow ligament plus  scar tissue.  We drilled laterally and then using the micro dissection we were able to remove the yellow ligament and decompress the thecal sac.  At the end, we had a wide decompression with plenty of space not only for the thecal sac but also for the L3 and L4 nerve root after we accomplished a foraminotomy. We did Valsalva maneuver up to 50 which was negative.  Nevertheless because the patient had surgery before and the patient had a lot of scar tissue, we decided to use some Tisseel in the area.  From then on, the wound was irrigated.  The incision was closed with multiple layers of Vicryl and staples for the skin.         ______________________________ Hilda Lias, M.D.    EB/MEDQ  D:  07/15/2011  T:  07/15/2011  Job:  [id number]

## 2011-07-17 NOTE — Discharge Summary (Signed)
  Admission and final diagnosis:l3 4 stenosis.s/p l4 5 fusion. Medications, oxycodone, baclofen. Diet, regular. Activity, no driving for 2 weeks. F/u 3 to 4 weeks or before,prn.

## 2011-07-21 NOTE — Progress Notes (Signed)
Utilization review completed. Alima Naser, RN, BSN. 07/21/11 

## 2013-01-06 ENCOUNTER — Emergency Department (HOSPITAL_COMMUNITY)
Admission: EM | Admit: 2013-01-06 | Discharge: 2013-01-06 | Disposition: A | Discharge status: Home / Self Care | Payer: BC Managed Care – PPO | Attending: Emergency Medicine | Admitting: Emergency Medicine

## 2013-01-06 ENCOUNTER — Encounter (HOSPITAL_COMMUNITY): Payer: Self-pay

## 2013-01-06 ENCOUNTER — Emergency Department (HOSPITAL_COMMUNITY): Payer: BC Managed Care – PPO

## 2013-01-06 DIAGNOSIS — M549 Dorsalgia, unspecified: Secondary | ICD-10-CM | POA: Insufficient documentation

## 2013-01-06 DIAGNOSIS — F17200 Nicotine dependence, unspecified, uncomplicated: Secondary | ICD-10-CM | POA: Insufficient documentation

## 2013-01-06 DIAGNOSIS — G479 Sleep disorder, unspecified: Secondary | ICD-10-CM | POA: Insufficient documentation

## 2013-01-06 DIAGNOSIS — M79609 Pain in unspecified limb: Secondary | ICD-10-CM | POA: Insufficient documentation

## 2013-01-06 DIAGNOSIS — Z8659 Personal history of other mental and behavioral disorders: Secondary | ICD-10-CM | POA: Insufficient documentation

## 2013-01-06 DIAGNOSIS — R443 Hallucinations, unspecified: Secondary | ICD-10-CM | POA: Insufficient documentation

## 2013-01-06 DIAGNOSIS — Z79899 Other long term (current) drug therapy: Secondary | ICD-10-CM | POA: Insufficient documentation

## 2013-01-06 DIAGNOSIS — I10 Essential (primary) hypertension: Secondary | ICD-10-CM | POA: Insufficient documentation

## 2013-01-06 DIAGNOSIS — G43909 Migraine, unspecified, not intractable, without status migrainosus: Secondary | ICD-10-CM | POA: Insufficient documentation

## 2013-01-06 DIAGNOSIS — N390 Urinary tract infection, site not specified: Secondary | ICD-10-CM | POA: Insufficient documentation

## 2013-01-06 DIAGNOSIS — Z8739 Personal history of other diseases of the musculoskeletal system and connective tissue: Secondary | ICD-10-CM | POA: Insufficient documentation

## 2013-01-06 LAB — COMPREHENSIVE METABOLIC PANEL
ALT: 41 U/L — ABNORMAL HIGH (ref 0–35)
CO2: 26 mEq/L (ref 19–32)
Calcium: 9.3 mg/dL (ref 8.4–10.5)
Creatinine, Ser: 0.73 mg/dL (ref 0.50–1.10)
GFR calc Af Amer: >90 mL/min (ref >90)
GFR calc non Af Amer: 90 mL/min — ABNORMAL LOW (ref >90)
Glucose, Bld: 101 mg/dL — ABNORMAL HIGH (ref 70–99)
Sodium: 143 mEq/L (ref 135–145)
Total Bilirubin: 0.1 mg/dL — ABNORMAL LOW (ref 0.3–1.2)

## 2013-01-06 LAB — URINALYSIS, ROUTINE W REFLEX MICROSCOPIC
Glucose, UA: NEGATIVE mg/dL
Specific Gravity, Urine: 1.016 (ref 1.005–1.030)

## 2013-01-06 LAB — RAPID URINE DRUG SCREEN, HOSP PERFORMED
Amphetamines: NOT DETECTED
Cocaine: NOT DETECTED
Opiates: POSITIVE — AB
Tetrahydrocannabinol: NOT DETECTED

## 2013-01-06 LAB — CBC WITH DIFFERENTIAL/PLATELET
Eosinophils Relative: 0 % (ref 0–5)
HCT: 39.7 % (ref 36.0–46.0)
Lymphocytes Relative: 51 % — ABNORMAL HIGH (ref 12–46)
Lymphs Abs: 6 10*3/uL — ABNORMAL HIGH (ref 0.7–4.0)
MCV: 91.9 fL (ref 78.0–100.0)
Monocytes Absolute: 1 10*3/uL (ref 0.1–1.0)
RBC: 4.32 MIL/uL (ref 3.87–5.11)
WBC: 11.7 10*3/uL — ABNORMAL HIGH (ref 4.0–10.5)

## 2013-01-06 LAB — URINE MICROSCOPIC-ADD ON

## 2013-01-06 MED ORDER — IBUPROFEN 400 MG PO TABS: 600.0000 mg | ORAL_TABLET | Freq: Three times a day (TID) | ORAL | Status: DC | PRN

## 2013-01-06 MED ORDER — METOPROLOL SUCCINATE ER 25 MG PO TB24
25.0000 mg | ORAL_TABLET | Freq: Every day | ORAL | Status: DC
  Filled 2013-01-06: qty 1

## 2013-01-06 MED ORDER — ZOLPIDEM TARTRATE 5 MG PO TABS: 5.0000 mg | ORAL_TABLET | Freq: Every evening | ORAL | Status: DC | PRN

## 2013-01-06 MED ORDER — HYDROCODONE-ACETAMINOPHEN 5-325 MG PO TABS
2.0000 | ORAL_TABLET | Freq: Once | ORAL | Status: AC
  Administered 2013-01-06: 2 via ORAL
  Filled 2013-01-06: qty 2

## 2013-01-06 MED ORDER — HYDROCODONE-ACETAMINOPHEN 7.5-325 MG PO TABS: 1.0000 | ORAL_TABLET | Freq: Four times a day (QID) | ORAL | Status: DC | PRN

## 2013-01-06 MED ORDER — ATORVASTATIN CALCIUM 40 MG PO TABS
40.0000 mg | ORAL_TABLET | Freq: Every day | ORAL | Status: DC
  Filled 2013-01-06: qty 1

## 2013-01-06 MED ORDER — LORATADINE 10 MG PO TABS
10.0000 mg | ORAL_TABLET | Freq: Every day | ORAL | Status: DC
  Filled 2013-01-06: qty 1

## 2013-01-06 MED ORDER — SULFAMETHOXAZOLE-TMP DS 800-160 MG PO TABS: 1.0000 | ORAL_TABLET | Freq: Two times a day (BID) | ORAL | Status: DC

## 2013-01-06 MED ORDER — VENLAFAXINE HCL 75 MG PO TABS
75.0000 mg | ORAL_TABLET | Freq: Two times a day (BID) | ORAL | Status: DC
  Filled 2013-01-06 (×2): qty 1

## 2013-01-06 MED ORDER — SULFAMETHOXAZOLE-TRIMETHOPRIM 800-160 MG PO TABS: 1.0000 | ORAL_TABLET | Freq: Two times a day (BID) | ORAL | Status: DC

## 2013-01-06 MED ORDER — LORAZEPAM 1 MG PO TABS: 1.0000 mg | ORAL_TABLET | Freq: Three times a day (TID) | ORAL | Status: DC | PRN

## 2013-01-06 MED ORDER — ACETAMINOPHEN 325 MG PO TABS: 650.0000 mg | ORAL_TABLET | ORAL | Status: DC | PRN

## 2013-01-06 MED ORDER — BUTALBITAL-APAP-CAFF-COD 50-325-40-30 MG PO CAPS
1.0000 | ORAL_CAPSULE | ORAL | Status: DC | PRN
  Filled 2013-01-06: qty 1

## 2013-01-06 NOTE — ED Notes (Signed)
Act team at bedside 

## 2013-01-06 NOTE — ED Provider Notes (Addendum)
History    CSN: [id number] Arrival date & time 01/06/13  0910  First MD Initiated Contact with Patient 01/06/13 [phone]     Chief Complaint  Patient presents with  . Back Pain  . Leg Pain   (Consider location/radiation/quality/duration/timing/severity/associated sxs/prior Treatment) The history is provided by the patient.  Nicole Melton is a 62 y.o. female history of hypertension, spinal stenosis status post multiple back surgeries, here presenting with worsening back pain and leg pain and hallucinations. She has been feeling weak in her legs for several weeks and was prescribed prednisone taper by PMD. She said has not helped and she ran out of her Vicodin a week ago. She said that since then she's been having a lot of pain especially at night the point that she is unable to sleep. She also has some weakness but that's been going on for several weeks and is not getting worse. She has some numbness in the left knee that is chronic as well. Denies any incontinence. She told her friend this morning at she is having some delusional thoughts that people were chasing her and she feels really depressed but denies any suicidal homicidal ideations. She also have been drinking more wine recently to help her with her pain. Denies drug use. She has history of depression but never had psychiatry admissions previously.   Past Medical History  Diagnosis Date  . Hypertension   . Spinal stenosis   . Migraines   . Arthritis   . Hemorrhoids   . Nocturia   . Depression    Past Surgical History  Procedure Laterality Date  . Back surgery  2001  . Bunionectomy    . Hammer toe surgery    . Cesarean section      x3  . Carpal tunnel release      bilateral  . Lumbar fusion    . Lumbar laminectomy/decompression microdiscectomy  07/15/2011    Procedure: LUMBAR LAMINECTOMY/DECOMPRESSION MICRODISCECTOMY;  Surgeon: Karn Cassis;  Location: [facility];  Service: Neurosurgery;  Laterality: N/A;  Lumbar  three - four Laminectomy   Family History  Problem Relation Age of Onset  . Anesthesia problems Neg Hx    History  Substance Use Topics  . Smoking status: Current Every Day Smoker -- 0.50 packs/day for 4 years  . Smokeless tobacco: Never Used  . Alcohol Use: 4.2 oz/week    7 Glasses of wine per week   OB History   Grav Para Term Preterm Abortions TAB SAB Ect Mult Living                 Review of Systems  Musculoskeletal: Positive for back pain.  Psychiatric/Behavioral: Positive for hallucinations and sleep disturbance.  All other systems reviewed and are negative.    Allergies  Review of patient's allergies indicates no known allergies.  Home Medications   Current Outpatient Rx  Name  Route  Sig  Dispense  Refill  . atorvastatin (LIPITOR) 40 MG tablet   Oral   Take 40 mg by mouth daily.         . butalbital-acetaminophen-caffeine (FIORICET WITH CODEINE) 50-325-40-30 MG per capsule   Oral   Take 1 capsule by mouth every 4 (four) hours as needed for headache.         . cetirizine (ZYRTEC) 10 MG tablet   Oral   Take 10 mg by mouth daily.          Marland Kitchen ibuprofen (ADVIL,MOTRIN) 200 MG tablet  Oral   Take 400 mg by mouth daily as needed. For pain.         . metoprolol succinate (TOPROL-XL) 25 MG 24 hr tablet   Oral   Take 25 mg by mouth daily.         . predniSONE (DELTASONE) 20 MG tablet   Oral   Take 20 mg by mouth daily. Taper: 3 tablets for 3 days, 2 tablets for 3 days, 1 tablet for 3 days, then one-half tablet for 3 days.         Marland Kitchen venlafaxine (EFFEXOR) 75 MG tablet   Oral   Take 75 mg by mouth 2 (two) times daily.          BP 135/67  Pulse 80  Temp(Src) 97.4 F (36.3 C) (Oral)  Resp 18  SpO2 96% Physical Exam  Nursing note and vitals reviewed. Constitutional: She is oriented to person, place, and time.  Tired, uncomfortable   HENT:  Head: Normocephalic.  Mouth/Throat: Oropharynx is clear and moist.  Eyes: Conjunctivae are normal.  Pupils are equal, round, and reactive to light.  Neck: Normal range of motion. Neck supple.  Cardiovascular: Normal rate, regular rhythm and normal heart sounds.   Pulmonary/Chest: Effort normal and breath sounds normal. No respiratory distress. She has no wheezes. She has no rales.  Abdominal: Soft. Bowel sounds are normal. She exhibits no distension. There is no tenderness. There is no rebound and no guarding.  Musculoskeletal:  Lower lumbar scar well healed, no bony tenderness.   Neurological: She is alert and oriented to person, place, and time.  Neg straight leg raise. Nl rhomberg. Stable gait. Neurovascular intact bilateral lower extremities.   Skin: Skin is warm and dry.  Psychiatric: She has a normal mood and affect. Her behavior is normal. Judgment and thought content normal.    ED Course  Procedures (including critical care time) Labs Reviewed  CBC WITH DIFFERENTIAL - Abnormal; Notable for the following:    WBC 11.7 (*)    Neutrophils Relative % 40 (*)    Lymphocytes Relative 51 (*)    Lymphs Abs 6.0 (*)    All other components within normal limits  COMPREHENSIVE METABOLIC PANEL - Abnormal; Notable for the following:    Glucose, Bld 101 (*)    ALT 41 (*)    Total Bilirubin 0.1 (*)    GFR calc non Af Amer 90 (*)    All other components within normal limits  ETHANOL - Abnormal; Notable for the following:    Alcohol, Ethyl (B) 239 (*)    All other components within normal limits  SALICYLATE LEVEL - Abnormal; Notable for the following:    Salicylate Lvl <0.2 (*)    All other components within normal limits  ACETAMINOPHEN LEVEL  URINALYSIS, ROUTINE W REFLEX MICROSCOPIC  URINE RAPID DRUG SCREEN (HOSP PERFORMED)   Dg Lumbar Spine Complete  01/06/2013   *RADIOLOGY REPORT*  Clinical Data: Low back pain and bilateral leg pain and numbness.  LUMBAR SPINE - COMPLETE 4+ VIEW  Comparison: 10/29/2011  Findings: Previous posterior fusion at L4-5 with chronic spondylolisthesis at that  level.  Previous posterior decompression at L3 and L4. Degenerative disc and joint disease at L3-4.  There is also disc space narrowing at L1-2.  No visible change since the prior study.  IMPRESSION: No acute abnormality of the lumbar spine.  Postsurgical and degenerative changes as described.   Original Report Authenticated By: Francene Boyers, M.D.   No diagnosis found.  MDM  Nicole Melton is a 62 y.o. female here with hallucinations and back pain. Back pain appeared chronic and she ran out of her pain meds. Will get xray but she has no neuro deficits to warrant MRI. Will also get telepsych involved given that she is having delusions and depression.   11:55 AM Labs at baseline. ETOH elevated. Symptoms can be due to intoxication. Will have ACT assess patient. Will place in pod C.   12:30 PM UA + UTI. Bactrim ordered.   1:24 PM ACT evaluated patient. She is not suicidal or homicidal. Thinks that her delusion is likely from lack of sleep. Will prescribe pain meds, ambien, and bactrim (for UTI). She is comfortable going home.   Richardean Canal, MD 01/06/13 1156  Richardean Canal, MD 01/06/13 1230  Richardean Canal, MD 01/06/13 1325

## 2013-01-06 NOTE — ED Notes (Signed)
Patient placed in blue scrubs and wanded by security.  

## 2013-01-06 NOTE — ED Notes (Signed)
Pt states she has severe back and leg pain. Pt denies injury, but has had back problems and surgeries previously. Pt states she cant sleep and weak.

## 2013-01-06 NOTE — ED Notes (Signed)
Pt appears to be lethargic/arousable today, denies taking any pain medications but pt states she has been drinking a lot lately and drank one bottle of wine last night.

## 2013-01-06 NOTE — ED Notes (Signed)
Gave Patient extra pillows to help with her back.  Pillows under her knees.
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
## 2013-01-06 NOTE — ED Notes (Signed)
Patient notified the need for urine sample. Patient verbalized understanding.

## 2013-01-08 LAB — URINE CULTURE: Colony Count: 15000

## 2013-02-13 ENCOUNTER — Other Ambulatory Visit: Payer: Self-pay | Admitting: Neurosurgery

## 2013-02-13 DIAGNOSIS — M5416 Radiculopathy, lumbar region: Secondary | ICD-10-CM

## 2013-02-24 ENCOUNTER — Ambulatory Visit
Admission: RE | Admit: 2013-02-24 | Discharge: 2013-02-24 | Disposition: A | Discharge status: Home / Self Care | Payer: BC Managed Care – PPO | Source: Ambulatory Visit | Attending: Neurosurgery | Admitting: Neurosurgery

## 2013-02-24 VITALS — BP 118/62 | HR 80

## 2013-02-24 DIAGNOSIS — M5416 Radiculopathy, lumbar region: Secondary | ICD-10-CM

## 2013-02-24 MED ORDER — MEPERIDINE HCL 100 MG/ML IJ SOLN
75.0000 mg | Freq: Once | INTRAMUSCULAR | Status: AC
  Administered 2013-02-24: 75 mg via INTRAMUSCULAR

## 2013-02-24 MED ORDER — ONDANSETRON HCL 4 MG/2ML IJ SOLN
4.0000 mg | Freq: Once | INTRAMUSCULAR | Status: AC
  Administered 2013-02-24: 4 mg via INTRAMUSCULAR

## 2013-02-24 MED ORDER — IOHEXOL 180 MG/ML  SOLN
20.0000 mL | Freq: Once | INTRAMUSCULAR | Status: AC | PRN
  Administered 2013-02-24: 20 mL via INTRATHECAL

## 2013-02-24 MED ORDER — DIAZEPAM 5 MG PO TABS
10.0000 mg | ORAL_TABLET | Freq: Once | ORAL | Status: AC
  Administered 2013-02-24: 10 mg via ORAL

## 2013-02-24 NOTE — Progress Notes (Signed)
Pt states she has been off effexor for the past 2 days.

## 2014-04-06 ENCOUNTER — Other Ambulatory Visit: Payer: Self-pay | Admitting: Neurosurgery

## 2014-04-26 ENCOUNTER — Encounter (HOSPITAL_COMMUNITY): Payer: Self-pay | Admitting: Pharmacy Technician

## 2014-05-01 ENCOUNTER — Encounter (HOSPITAL_COMMUNITY): Payer: Self-pay

## 2014-05-01 ENCOUNTER — Encounter (HOSPITAL_COMMUNITY)
Admission: RE | Admit: 2014-05-01 | Discharge: 2014-05-01 | Disposition: A | Discharge status: Home / Self Care | Payer: BC Managed Care – PPO | Source: Ambulatory Visit | Attending: Neurosurgery | Admitting: Neurosurgery

## 2014-05-01 DIAGNOSIS — D649 Anemia, unspecified: Secondary | ICD-10-CM | POA: Insufficient documentation

## 2014-05-01 DIAGNOSIS — I10 Essential (primary) hypertension: Secondary | ICD-10-CM | POA: Insufficient documentation

## 2014-05-01 DIAGNOSIS — Z01818 Encounter for other preprocedural examination: Secondary | ICD-10-CM | POA: Diagnosis present

## 2014-05-01 DIAGNOSIS — K219 Gastro-esophageal reflux disease without esophagitis: Secondary | ICD-10-CM | POA: Insufficient documentation

## 2014-05-01 DIAGNOSIS — M4800 Spinal stenosis, site unspecified: Secondary | ICD-10-CM | POA: Diagnosis not present

## 2014-05-01 DIAGNOSIS — Z720 Tobacco use: Secondary | ICD-10-CM | POA: Diagnosis not present

## 2014-05-01 DIAGNOSIS — Z6832 Body mass index (BMI) 32.0-32.9, adult: Secondary | ICD-10-CM | POA: Insufficient documentation

## 2014-05-01 DIAGNOSIS — G43909 Migraine, unspecified, not intractable, without status migrainosus: Secondary | ICD-10-CM | POA: Diagnosis not present

## 2014-05-01 DIAGNOSIS — F329 Major depressive disorder, single episode, unspecified: Secondary | ICD-10-CM | POA: Insufficient documentation

## 2014-05-01 HISTORY — DX: Other specified postprocedural states: Z98.890

## 2014-05-01 HISTORY — DX: Nausea with vomiting, unspecified: R11.2

## 2014-05-01 HISTORY — DX: Presence of spectacles and contact lenses: Z97.3

## 2014-05-01 HISTORY — DX: Pneumonia, unspecified organism: J18.9

## 2014-05-01 HISTORY — DX: Anemia, unspecified: D64.9

## 2014-05-01 HISTORY — DX: Gastro-esophageal reflux disease without esophagitis: K21.9

## 2014-05-01 LAB — CBC
HCT: 41.2 % (ref 36.0–46.0)
Hemoglobin: 14.1 g/dL (ref 12.0–15.0)
MCH: 32.3 pg (ref 26.0–34.0)
MCHC: 34.2 g/dL (ref 30.0–36.0)
MCV: 94.3 fL (ref 78.0–100.0)
PLATELETS: 271 10*3/uL (ref 150–400)
RBC: 4.37 MIL/uL (ref 3.87–5.11)
RDW: 13.1 % (ref 11.5–15.5)
WBC: 8.5 10*3/uL (ref 4.0–10.5)

## 2014-05-01 LAB — BASIC METABOLIC PANEL
Anion gap: 15 (ref 5–15)
BUN: 17 mg/dL (ref 6–23)
CO2: 20 mEq/L (ref 19–32)
Calcium: 9.4 mg/dL (ref 8.4–10.5)
Chloride: 102 mEq/L (ref 96–112)
Creatinine, Ser: 0.68 mg/dL (ref 0.50–1.10)
Glucose, Bld: 122 mg/dL — ABNORMAL HIGH (ref 70–99)
POTASSIUM: 4.6 meq/L (ref 3.7–5.3)
Sodium: 137 mEq/L (ref 137–147)

## 2014-05-01 LAB — SURGICAL PCR SCREEN
MRSA, PCR: NEGATIVE
STAPHYLOCOCCUS AUREUS: NEGATIVE

## 2014-05-01 LAB — ABO/RH: ABO/RH(D): A POS

## 2014-05-01 LAB — TYPE AND SCREEN
ABO/RH(D): A POS
Antibody Screen: NEGATIVE

## 2014-05-01 NOTE — Progress Notes (Signed)
Pt denies SOB, chest pain, and being under the care of a cardiologist. Pt denies having a stress test, echo, and cardiac cath. Pt denies having a chest x ray and EKG within the last year. Pt stated that she takes her Metoprolol at night. Pt chart forwared to [place], [place] ( anesthesia) for review.

## 2014-05-01 NOTE — Pre-Procedure Instructions (Signed)
Nicole Melton  05/01/2014   Your procedure is scheduled on: Wednesday, May 09, 2014  [facility] Admitting at 6:30 AM.  Call this number if you have problems the morning of surgery: [phone]   Remember:   Do not eat food or drink liquids after midnight Tuesday, May 08, 2014   Take these medicines the morning of surgery with A SIP OF WATER: none   Stop taking Aspirin, vitamins, and herbal medications. Do not take any NSAIDs ie: Ibuprofen, Advil, Naproxen,  Aleve or any medication containing Aspirin; stop 5 days prior to procedure (Saturday, May 05, 2014).   Do not wear jewelry, make-up or nail polish.  Do not wear lotions, powders, or perfumes. You may not wear deodorant.  Do not shave 48 hours prior to surgery.   Do not bring valuables to the hospital.  [facility] is not responsible for any belongings or valuables.               Contacts, dentures or bridgework may not be worn into surgery.  Leave suitcase in the car. After surgery it may be brought to your room.  For patients admitted to the hospital, discharge time is determined by your treatment team.               Patients discharged the day of surgery will not be allowed to drive home.  Name and phone number of your driver:   Special Instructions:  Special Instructions:Special Instructions: [facility] - Preparing for Surgery  Before surgery, you can play an important role.  Because skin is not sterile, your skin needs to be as free of germs as possible.  You can reduce the number of germs on you skin by washing with CHG (chlorahexidine gluconate) soap before surgery.  CHG is an antiseptic cleaner which kills germs and bonds with the skin to continue killing germs even after washing.  Please DO NOT use if you have an allergy to CHG or antibacterial soaps.  If your skin becomes reddened/irritated stop using the CHG and inform your nurse when you arrive at Short Stay.  Do not shave  (including legs and underarms) for at least 48 hours prior to the first CHG shower.  You may shave your face.  Please follow these instructions carefully:   1.  Shower with CHG Soap the night before surgery and the morning of Surgery.  2.  If you choose to wash your hair, wash your hair first as usual with your normal shampoo.  3.  After you shampoo, rinse your hair and body thoroughly to remove the Shampoo.  4.  Use CHG as you would any other liquid soap.  You can apply chg directly  to the skin and wash gently with scrungie or a clean washcloth.  5.  Apply the CHG Soap to your body ONLY FROM THE NECK DOWN.  Do not use on open wounds or open sores.  Avoid contact with your eyes, ears, mouth and genitals (private parts).  Wash genitals (private parts) with your normal soap.  6.  Wash thoroughly, paying special attention to the area where your surgery will be performed.  7.  Thoroughly rinse your body with warm water from the neck down.  8.  DO NOT shower/wash with your normal soap after using and rinsing off the CHG Soap.  9.  Pat yourself dry with a clean towel.            10.  Wear  clean pajamas.            11.  Place clean sheets on your bed the night of your first shower and do not sleep with pets.  Day of Surgery  Do not apply any lotions/deodorants the morning of surgery.  Please wear clean clothes to the hospital/surgery center.   Please read over the following fact sheets that you were given: Pain Booklet, Coughing and Deep Breathing, Blood Transfusion Information, MRSA Information and Surgical Site Infection Prevention

## 2014-05-02 NOTE — Progress Notes (Signed)
Anesthesia Chart Review:  Pt is 63 year old female scheduled for L3-4, L4-5 PLIF on 05/09/2014 with Dr. Jeral FruitBotero.   PMH: HTN, spinal stenosis, GERD, depression, anemia, migraines. Current smoker. BMI 32  Medications include: metoprolol, ibuprofen, zyrtec  Preoperative labs reviewed.    EKG: Normal sinus rhythm Possible Left atrial enlargement Septal infarct , age undetermined No significant change from previous EKG 2013  If no changes, I anticipate pt can proceed with surgery as scheduled.   Rica Mastngela Mackinzee Roszak, FNP-BC [facility]/Anesthesiology Phone: [phone] 05/02/2014 2:22 PM

## 2014-05-07 NOTE — Anesthesia Preprocedure Evaluation (Addendum)
Anesthesia Evaluation  Patient identified by MRN, date of birth, ID band Patient awake    Reviewed: Allergy & Precautions, H&P , NPO status , Patient's Chart, lab work & pertinent test results  History of Anesthesia Complications (+) PONV and history of anesthetic complications  Airway Mallampati: II  TM Distance: >3 FB Neck ROM: Full    Dental  (+) Teeth Intact, Dental Advisory Given   Pulmonary COPDCurrent Smoker,  breath sounds clear to auscultation        Cardiovascular hypertension, Pt. on medications and Pt. on home beta blockers - anginaRhythm:Regular Rate:Normal  EKG possible septal MI, no change from other EKG   Neuro/Psych Depression Chronic back pain    GI/Hepatic Neg liver ROS, GERD-  Controlled,  Endo/Other  Morbid obesity  Renal/GU negative Renal ROS     Musculoskeletal   Abdominal (+) + obese,   Peds  Hematology negative hematology ROS (+)   Anesthesia Other Findings   Reproductive/Obstetrics                           Anesthesia Physical Anesthesia Plan  ASA: II  Anesthesia Plan: General   Post-op Pain Management:    Induction: Intravenous  Airway Management Planned: Oral ETT  Additional Equipment:   Intra-op Plan:   Post-operative Plan: Extubation in OR  Informed Consent: I have reviewed the patients History and Physical, chart, labs and discussed the procedure including the risks, benefits and alternatives for the proposed anesthesia with the patient or authorized representative who has indicated his/her understanding and acceptance.   Dental advisory given  Plan Discussed with: CRNA and Surgeon  Anesthesia Plan Comments: (Plan routine monitors, GETA)       Anesthesia Quick Evaluation

## 2014-05-08 MED ORDER — SCOPOLAMINE 1 MG/3DAYS TD PT72
1.0000 | MEDICATED_PATCH | TRANSDERMAL | Status: DC
  Administered 2014-05-09: 1 via TRANSDERMAL
  Filled 2014-05-08: qty 1

## 2014-05-08 MED ORDER — CEFAZOLIN SODIUM-DEXTROSE 2-3 GM-% IV SOLR
2.0000 g | INTRAVENOUS | Status: AC
  Administered 2014-05-09: 2 g via INTRAVENOUS
  Filled 2014-05-08: qty 50

## 2014-05-08 NOTE — H&P (Signed)
Nicole Melton is an 63 y.o. female.   Chief Complaint: lumbar pain    HPI: patient who in the past had l45 decompression [place] and later on decompression by me at l34. She came to see me in my office with worsening of the pain up to the point than walking is so difficult, she has to sit to get rid of the pain.   Past Medical History  Diagnosis Date  . Hypertension   . Spinal stenosis   . Migraines   . Arthritis   . Hemorrhoids   . Nocturia   . Depression   . PONV (postoperative nausea and vomiting)   . Wears glasses   . Pneumonia   . GERD (gastroesophageal reflux disease)   . Anemia     Past Surgical History  Procedure Laterality Date  . Back surgery  2001  . Bunionectomy    . Hammer toe surgery    . Cesarean section      x3  . Carpal tunnel release      bilateral  . Lumbar fusion    . Lumbar laminectomy/decompression microdiscectomy  07/15/2011    Procedure: LUMBAR LAMINECTOMY/DECOMPRESSION MICRODISCECTOMY;  Surgeon: Karn CassisErnesto M Cherolyn Behrle;  Location: [facility];  Service: Neurosurgery;  Laterality: N/A;  Lumbar three - four Laminectomy  . Colonoscopy w/ biopsies and polypectomy    . Dilation and curettage of uterus    . Ectopic pregnancy surgery      Family History  Problem Relation Age of Onset  . Anesthesia problems Neg Hx   . Hypertension Other   . Diabetes Other   . Cancer Other    Social History:  reports that she has been smoking.  She has never used smokeless tobacco. She reports that she drinks about 4.2 oz of alcohol per week. She reports that she does not use illicit drugs.  Allergies: No Known Allergies  No prescriptions prior to admission    No results found for this or any previous visit (from the past 48 hour(s)). No results found.  Review of Systems  Constitutional: Negative.   HENT: Negative.   Eyes: Negative.   Respiratory: Negative.   Cardiovascular: Negative.   Genitourinary: Negative.   Musculoskeletal: Positive for back  pain.  Skin: Negative.   Neurological: Positive for sensory change and focal weakness.  Endo/Heme/Allergies: Negative.   Psychiatric/Behavioral: Negative.     There were no vitals taken for this visit. Physical Exam hent, nl. Neck, nl. Cv, nl. Lungs,clear. Abdomen, soft extremities, nl. NEURO WEAKNESS DF RIGHR FOOT. slr POSITIVE AT 60 DEGREES. CAN NOT WALK IN TIP TOES OR HEEELS. Myelogram shows stenosis with facet arthropathy at l34 , spondylolisthesis 45  Assessment Decompression and fusion at l34, 45. She is aware of risks and benefits. Also the possibility about not been able to remove the previous facet screws and having to do a pla at l45 Nicole Melton M 05/08/2014, 5:12 PM

## 2014-05-09 ENCOUNTER — Inpatient Hospital Stay (HOSPITAL_COMMUNITY): Payer: BC Managed Care – PPO

## 2014-05-09 ENCOUNTER — Inpatient Hospital Stay (HOSPITAL_COMMUNITY)
Admission: RE | Admit: 2014-05-09 | Discharge: 2014-05-14 | DRG: 460 | Disposition: A | Discharge status: Home / Self Care | Payer: BC Managed Care – PPO | Source: Ambulatory Visit | Attending: Neurosurgery | Admitting: Neurosurgery

## 2014-05-09 ENCOUNTER — Inpatient Hospital Stay (HOSPITAL_COMMUNITY): Payer: BC Managed Care – PPO | Admitting: Anesthesiology

## 2014-05-09 ENCOUNTER — Encounter (HOSPITAL_COMMUNITY): Payer: Self-pay | Admitting: *Deleted

## 2014-05-09 ENCOUNTER — Encounter (HOSPITAL_COMMUNITY)
Admission: RE | Disposition: A | Discharge status: Home / Self Care | Payer: BC Managed Care – PPO | Source: Ambulatory Visit | Attending: Neurosurgery

## 2014-05-09 ENCOUNTER — Inpatient Hospital Stay (HOSPITAL_COMMUNITY): Payer: BC Managed Care – PPO | Admitting: Emergency Medicine

## 2014-05-09 DIAGNOSIS — M5116 Intervertebral disc disorders with radiculopathy, lumbar region: Secondary | ICD-10-CM | POA: Diagnosis present

## 2014-05-09 DIAGNOSIS — E669 Obesity, unspecified: Secondary | ICD-10-CM | POA: Diagnosis present

## 2014-05-09 DIAGNOSIS — M129 Arthropathy, unspecified: Secondary | ICD-10-CM | POA: Diagnosis present

## 2014-05-09 DIAGNOSIS — Z8249 Family history of ischemic heart disease and other diseases of the circulatory system: Secondary | ICD-10-CM

## 2014-05-09 DIAGNOSIS — M4806 Spinal stenosis, lumbar region: Secondary | ICD-10-CM | POA: Diagnosis present

## 2014-05-09 DIAGNOSIS — G43909 Migraine, unspecified, not intractable, without status migrainosus: Secondary | ICD-10-CM | POA: Diagnosis present

## 2014-05-09 DIAGNOSIS — K219 Gastro-esophageal reflux disease without esophagitis: Secondary | ICD-10-CM | POA: Diagnosis present

## 2014-05-09 DIAGNOSIS — I10 Essential (primary) hypertension: Secondary | ICD-10-CM | POA: Diagnosis present

## 2014-05-09 DIAGNOSIS — J449 Chronic obstructive pulmonary disease, unspecified: Secondary | ICD-10-CM | POA: Diagnosis present

## 2014-05-09 DIAGNOSIS — Z23 Encounter for immunization: Secondary | ICD-10-CM

## 2014-05-09 DIAGNOSIS — Z981 Arthrodesis status: Secondary | ICD-10-CM | POA: Diagnosis not present

## 2014-05-09 DIAGNOSIS — Z833 Family history of diabetes mellitus: Secondary | ICD-10-CM

## 2014-05-09 DIAGNOSIS — M549 Dorsalgia, unspecified: Secondary | ICD-10-CM | POA: Diagnosis present

## 2014-05-09 DIAGNOSIS — Z6831 Body mass index (BMI) 31.0-31.9, adult: Secondary | ICD-10-CM

## 2014-05-09 DIAGNOSIS — F17210 Nicotine dependence, cigarettes, uncomplicated: Secondary | ICD-10-CM | POA: Diagnosis present

## 2014-05-09 DIAGNOSIS — Z09 Encounter for follow-up examination after completed treatment for conditions other than malignant neoplasm: Secondary | ICD-10-CM

## 2014-05-09 DIAGNOSIS — F329 Major depressive disorder, single episode, unspecified: Secondary | ICD-10-CM | POA: Diagnosis present

## 2014-05-09 DIAGNOSIS — M4310 Spondylolisthesis, site unspecified: Secondary | ICD-10-CM | POA: Diagnosis present

## 2014-05-09 DIAGNOSIS — Z419 Encounter for procedure for purposes other than remedying health state, unspecified: Secondary | ICD-10-CM

## 2014-05-09 DIAGNOSIS — M5136 Other intervertebral disc degeneration, lumbar region: Secondary | ICD-10-CM | POA: Diagnosis present

## 2014-05-09 DIAGNOSIS — M51369 Other intervertebral disc degeneration, lumbar region without mention of lumbar back pain or lower extremity pain: Secondary | ICD-10-CM | POA: Diagnosis present

## 2014-05-09 LAB — GLUCOSE, CAPILLARY: Glucose-Capillary: 174 mg/dL — ABNORMAL HIGH (ref 70–99)

## 2014-05-09 SURGERY — POSTERIOR LUMBAR FUSION 2 LEVEL
Anesthesia: General

## 2014-05-09 MED ORDER — FENTANYL CITRATE 0.05 MG/ML IJ SOLN
INTRAMUSCULAR | Status: AC
  Filled 2014-05-09: qty 5

## 2014-05-09 MED ORDER — OXYCODONE-ACETAMINOPHEN 5-325 MG PO TABS
ORAL_TABLET | ORAL | Status: AC
  Administered 2014-05-09: 1
  Filled 2014-05-09: qty 2

## 2014-05-09 MED ORDER — GLYCOPYRROLATE 0.2 MG/ML IJ SOLN
INTRAMUSCULAR | Status: AC
  Filled 2014-05-09: qty 3

## 2014-05-09 MED ORDER — DEXAMETHASONE SODIUM PHOSPHATE 4 MG/ML IJ SOLN
INTRAMUSCULAR | Status: AC
  Filled 2014-05-09: qty 2

## 2014-05-09 MED ORDER — NALOXONE HCL 0.4 MG/ML IJ SOLN: 0.4000 mg | INTRAMUSCULAR | Status: DC | PRN

## 2014-05-09 MED ORDER — ONDANSETRON HCL 4 MG/2ML IJ SOLN: 4.0000 mg | Freq: Four times a day (QID) | INTRAMUSCULAR | Status: DC | PRN

## 2014-05-09 MED ORDER — 0.9 % SODIUM CHLORIDE (POUR BTL) OPTIME
TOPICAL | Status: DC | PRN
  Administered 2014-05-09: 1000 mL

## 2014-05-09 MED ORDER — INFLUENZA VAC SPLIT QUAD 0.5 ML IM SUSY
0.5000 mL | PREFILLED_SYRINGE | INTRAMUSCULAR | Status: AC
  Administered 2014-05-10: 0.5 mL via INTRAMUSCULAR
  Filled 2014-05-09: qty 0.5

## 2014-05-09 MED ORDER — PROPOFOL 10 MG/ML IV BOLUS
INTRAVENOUS | Status: AC
  Filled 2014-05-09: qty 20

## 2014-05-09 MED ORDER — MIDAZOLAM HCL 5 MG/5ML IJ SOLN
INTRAMUSCULAR | Status: DC | PRN
  Administered 2014-05-09: 2 mg via INTRAVENOUS

## 2014-05-09 MED ORDER — MENTHOL 3 MG MT LOZG: 1.0000 | LOZENGE | OROMUCOSAL | Status: DC | PRN

## 2014-05-09 MED ORDER — DIPHENHYDRAMINE HCL 12.5 MG/5ML PO ELIX: 12.5000 mg | ORAL_SOLUTION | Freq: Four times a day (QID) | ORAL | Status: DC | PRN

## 2014-05-09 MED ORDER — FENTANYL CITRATE 0.05 MG/ML IJ SOLN
INTRAMUSCULAR | Status: AC
  Filled 2014-05-09: qty 2

## 2014-05-09 MED ORDER — FENTANYL CITRATE 0.05 MG/ML IJ SOLN
INTRAMUSCULAR | Status: DC | PRN
  Administered 2014-05-09: 100 ug via INTRAVENOUS
  Administered 2014-05-09 (×3): 50 ug via INTRAVENOUS
  Administered 2014-05-09: 250 ug via INTRAVENOUS

## 2014-05-09 MED ORDER — BUPIVACAINE LIPOSOME 1.3 % IJ SUSP
20.0000 mL | INTRAMUSCULAR | Status: DC
  Filled 2014-05-09: qty 20

## 2014-05-09 MED ORDER — BUPIVACAINE LIPOSOME 1.3 % IJ SUSP
INTRAMUSCULAR | Status: DC | PRN
  Administered 2014-05-09: 20 mL

## 2014-05-09 MED ORDER — ONDANSETRON HCL 4 MG/2ML IJ SOLN
INTRAMUSCULAR | Status: DC | PRN
  Administered 2014-05-09: 4 mg via INTRAVENOUS

## 2014-05-09 MED ORDER — ONDANSETRON HCL 4 MG/2ML IJ SOLN: 4.0000 mg | INTRAMUSCULAR | Status: DC | PRN

## 2014-05-09 MED ORDER — METOPROLOL SUCCINATE ER 25 MG PO TB24
25.0000 mg | ORAL_TABLET | Freq: Every day | ORAL | Status: DC
  Administered 2014-05-09 – 2014-05-13 (×3): 25 mg via ORAL
  Filled 2014-05-09 (×3): qty 1

## 2014-05-09 MED ORDER — SODIUM CHLORIDE 0.9 % IV SOLN: 250.0000 mL | INTRAVENOUS | Status: DC

## 2014-05-09 MED ORDER — DIPHENHYDRAMINE HCL 50 MG/ML IJ SOLN: 12.5000 mg | Freq: Four times a day (QID) | INTRAMUSCULAR | Status: DC | PRN

## 2014-05-09 MED ORDER — ROCURONIUM BROMIDE 100 MG/10ML IV SOLN
INTRAVENOUS | Status: DC | PRN
  Administered 2014-05-09: 20 mg via INTRAVENOUS
  Administered 2014-05-09 (×2): 10 mg via INTRAVENOUS
  Administered 2014-05-09: 50 mg via INTRAVENOUS
  Administered 2014-05-09: 10 mg via INTRAVENOUS
  Administered 2014-05-09: 20 mg via INTRAVENOUS

## 2014-05-09 MED ORDER — ZOLPIDEM TARTRATE 5 MG PO TABS
5.0000 mg | ORAL_TABLET | Freq: Every evening | ORAL | Status: DC | PRN
  Administered 2014-05-12 – 2014-05-13 (×2): 5 mg via ORAL
  Filled 2014-05-09 (×2): qty 1

## 2014-05-09 MED ORDER — VANCOMYCIN HCL 1000 MG IV SOLR
INTRAVENOUS | Status: DC | PRN
  Administered 2014-05-09 (×2): 1000 mg

## 2014-05-09 MED ORDER — EPHEDRINE SULFATE 50 MG/ML IJ SOLN
INTRAMUSCULAR | Status: AC
  Filled 2014-05-09: qty 1

## 2014-05-09 MED ORDER — LACTATED RINGERS IV SOLN
INTRAVENOUS | Status: DC | PRN
  Administered 2014-05-09 (×3): via INTRAVENOUS

## 2014-05-09 MED ORDER — MEPERIDINE HCL 25 MG/ML IJ SOLN: 6.2500 mg | INTRAMUSCULAR | Status: DC | PRN

## 2014-05-09 MED ORDER — STERILE WATER FOR INJECTION IJ SOLN
INTRAMUSCULAR | Status: AC
  Filled 2014-05-09: qty 10

## 2014-05-09 MED ORDER — LIDOCAINE HCL (CARDIAC) 20 MG/ML IV SOLN
INTRAVENOUS | Status: AC
  Filled 2014-05-09: qty 5

## 2014-05-09 MED ORDER — VANCOMYCIN HCL 1000 MG IV SOLR
INTRAVENOUS | Status: AC
Start: 2014-05-09 — End: 2014-05-09
  Filled 2014-05-09: qty 2000

## 2014-05-09 MED ORDER — SODIUM CHLORIDE 0.9 % IJ SOLN
3.0000 mL | Freq: Two times a day (BID) | INTRAMUSCULAR | Status: DC
  Administered 2014-05-09: 3 mL via INTRAVENOUS

## 2014-05-09 MED ORDER — CEFAZOLIN SODIUM 1-5 GM-% IV SOLN
1.0000 g | Freq: Three times a day (TID) | INTRAVENOUS | Status: AC
  Administered 2014-05-09 (×2): 1 g via INTRAVENOUS
  Filled 2014-05-09: qty 50

## 2014-05-09 MED ORDER — GLYCOPYRROLATE 0.2 MG/ML IJ SOLN
INTRAMUSCULAR | Status: DC | PRN
  Administered 2014-05-09: .7 mg via INTRAVENOUS
  Administered 2014-05-09: 0.2 mg via INTRAVENOUS

## 2014-05-09 MED ORDER — DIAZEPAM 5 MG PO TABS
5.0000 mg | ORAL_TABLET | Freq: Four times a day (QID) | ORAL | Status: DC | PRN
  Administered 2014-05-09 – 2014-05-14 (×13): 5 mg via ORAL
  Filled 2014-05-09 (×12): qty 1

## 2014-05-09 MED ORDER — DEXAMETHASONE SODIUM PHOSPHATE 4 MG/ML IJ SOLN
INTRAMUSCULAR | Status: DC | PRN
  Administered 2014-05-09: 8 mg via INTRAVENOUS

## 2014-05-09 MED ORDER — SODIUM CHLORIDE 0.9 % IJ SOLN: 3.0000 mL | INTRAMUSCULAR | Status: DC | PRN

## 2014-05-09 MED ORDER — FENTANYL CITRATE 0.05 MG/ML IJ SOLN
25.0000 ug | INTRAMUSCULAR | Status: DC | PRN
  Administered 2014-05-09 (×3): 50 ug via INTRAVENOUS

## 2014-05-09 MED ORDER — PNEUMOCOCCAL VAC POLYVALENT 25 MCG/0.5ML IJ INJ
0.5000 mL | INJECTION | INTRAMUSCULAR | Status: AC
  Administered 2014-05-10: 0.5 mL via INTRAMUSCULAR
  Filled 2014-05-09: qty 0.5

## 2014-05-09 MED ORDER — PROPOFOL 10 MG/ML IV BOLUS
INTRAVENOUS | Status: DC | PRN
  Administered 2014-05-09: 140 mg via INTRAVENOUS

## 2014-05-09 MED ORDER — FENTANYL 10 MCG/ML IV SOLN
INTRAVENOUS | Status: DC
  Administered 2014-05-09: 13:00:00 via INTRAVENOUS
  Filled 2014-05-09: qty 50

## 2014-05-09 MED ORDER — ACETAMINOPHEN 325 MG PO TABS: 650.0000 mg | ORAL_TABLET | ORAL | Status: DC | PRN

## 2014-05-09 MED ORDER — NEOSTIGMINE METHYLSULFATE 10 MG/10ML IV SOLN
INTRAVENOUS | Status: DC | PRN
  Administered 2014-05-09: 4 mg via INTRAVENOUS

## 2014-05-09 MED ORDER — MIDAZOLAM HCL 2 MG/2ML IJ SOLN
INTRAMUSCULAR | Status: AC
  Filled 2014-05-09: qty 2

## 2014-05-09 MED ORDER — SODIUM CHLORIDE 0.9 % IJ SOLN: 9.0000 mL | INTRAMUSCULAR | Status: DC | PRN

## 2014-05-09 MED ORDER — LIDOCAINE HCL (CARDIAC) 20 MG/ML IV SOLN
INTRAVENOUS | Status: DC | PRN
  Administered 2014-05-09: 50 mg via INTRAVENOUS

## 2014-05-09 MED ORDER — LORATADINE 10 MG PO TABS
10.0000 mg | ORAL_TABLET | Freq: Every day | ORAL | Status: DC
  Administered 2014-05-09 – 2014-05-14 (×6): 10 mg via ORAL
  Filled 2014-05-09 (×7): qty 1

## 2014-05-09 MED ORDER — PROMETHAZINE HCL 25 MG/ML IJ SOLN: 6.2500 mg | INTRAMUSCULAR | Status: DC | PRN

## 2014-05-09 MED ORDER — THROMBIN 20000 UNITS EX SOLR
CUTANEOUS | Status: DC | PRN
  Administered 2014-05-09: 08:00:00 via TOPICAL

## 2014-05-09 MED ORDER — PHENOL 1.4 % MT LIQD: 1.0000 | OROMUCOSAL | Status: DC | PRN

## 2014-05-09 MED ORDER — OXYCODONE-ACETAMINOPHEN 5-325 MG PO TABS
2.0000 | ORAL_TABLET | ORAL | Status: DC | PRN
  Administered 2014-05-09 – 2014-05-10 (×6): 2 via ORAL
  Filled 2014-05-09 (×5): qty 2

## 2014-05-09 MED ORDER — ROCURONIUM BROMIDE 50 MG/5ML IV SOLN
INTRAVENOUS | Status: AC
  Filled 2014-05-09: qty 2

## 2014-05-09 MED ORDER — PHENYLEPHRINE HCL 10 MG/ML IJ SOLN
INTRAMUSCULAR | Status: AC
  Filled 2014-05-09: qty 1

## 2014-05-09 MED ORDER — EPHEDRINE SULFATE 50 MG/ML IJ SOLN
INTRAMUSCULAR | Status: DC | PRN
  Administered 2014-05-09: 10 mg via INTRAVENOUS

## 2014-05-09 MED ORDER — ACETAMINOPHEN 650 MG RE SUPP: 650.0000 mg | RECTAL | Status: DC | PRN

## 2014-05-09 MED ORDER — SODIUM CHLORIDE 0.9 % IV SOLN
INTRAVENOUS | Status: DC
  Administered 2014-05-09: 16:00:00 via INTRAVENOUS

## 2014-05-09 MED ORDER — DIAZEPAM 5 MG PO TABS
ORAL_TABLET | ORAL | Status: AC
  Filled 2014-05-09: qty 1

## 2014-05-09 SURGICAL SUPPLY — 72 items
BENZOIN TINCTURE PRP APPL 2/3 (GAUZE/BANDAGES/DRESSINGS) ×3 IMPLANT
BLADE CLIPPER SURG (BLADE) IMPLANT
BUR ACORN 6.0 (BURR) ×2 IMPLANT
BUR ACORN 6.0MM (BURR) ×1
BUR MATCHSTICK NEURO 3.0 LAGG (BURR) ×3 IMPLANT
CANISTER SUCT 3000ML (MISCELLANEOUS) ×3 IMPLANT
CAP LOCKING THREADED (Cap) ×12 IMPLANT
CLOSURE WOUND 1/2 X4 (GAUZE/BANDAGES/DRESSINGS) ×1
CONT SPEC 4OZ CLIKSEAL STRL BL (MISCELLANEOUS) ×6 IMPLANT
COVER BACK TABLE 60X90IN (DRAPES) ×3 IMPLANT
CROSSLINK SPINAL 38-50MM (Neuro Prosthesis/Implant) ×3 IMPLANT
DRAPE C-ARM 42X72 X-RAY (DRAPES) ×9 IMPLANT
DRAPE LAPAROTOMY 100X72X124 (DRAPES) ×3 IMPLANT
DRAPE POUCH INSTRU U-SHP 10X18 (DRAPES) ×3 IMPLANT
DRSG OPSITE POSTOP 4X8 (GAUZE/BANDAGES/DRESSINGS) ×3 IMPLANT
DRSG PAD ABDOMINAL 8X10 ST (GAUZE/BANDAGES/DRESSINGS) IMPLANT
DURAPREP 26ML APPLICATOR (WOUND CARE) ×3 IMPLANT
ELECT BLADE 4.0 EZ CLEAN MEGAD (MISCELLANEOUS) ×3
ELECT REM PT RETURN 9FT ADLT (ELECTROSURGICAL) ×3
ELECTRODE BLDE 4.0 EZ CLN MEGD (MISCELLANEOUS) ×1 IMPLANT
ELECTRODE REM PT RTRN 9FT ADLT (ELECTROSURGICAL) ×1 IMPLANT
EVACUATOR 1/8 PVC DRAIN (DRAIN) ×3 IMPLANT
GAUZE SPONGE 4X4 12PLY STRL (GAUZE/BANDAGES/DRESSINGS) ×3 IMPLANT
GAUZE SPONGE 4X4 16PLY XRAY LF (GAUZE/BANDAGES/DRESSINGS) ×3 IMPLANT
GLOVE BIO SURGEON STRL SZ8.5 (GLOVE) ×3 IMPLANT
GLOVE BIOGEL M 8.0 STRL (GLOVE) ×3 IMPLANT
GLOVE ECLIPSE 7.5 STRL STRAW (GLOVE) ×15 IMPLANT
GLOVE EXAM NITRILE LRG STRL (GLOVE) IMPLANT
GLOVE EXAM NITRILE MD LF STRL (GLOVE) IMPLANT
GLOVE EXAM NITRILE XL STR (GLOVE) IMPLANT
GLOVE EXAM NITRILE XS STR PU (GLOVE) IMPLANT
GLOVE INDICATOR 7.5 STRL GRN (GLOVE) ×6 IMPLANT
GLOVE INDICATOR 8.0 STRL GRN (GLOVE) ×6 IMPLANT
GLOVE OPTIFIT SS 8.0 STRL (GLOVE) ×3 IMPLANT
GOWN STRL REUS W/ TWL LRG LVL3 (GOWN DISPOSABLE) ×1 IMPLANT
GOWN STRL REUS W/ TWL XL LVL3 (GOWN DISPOSABLE) ×1 IMPLANT
GOWN STRL REUS W/TWL 2XL LVL3 (GOWN DISPOSABLE) ×6 IMPLANT
GOWN STRL REUS W/TWL LRG LVL3 (GOWN DISPOSABLE) ×2
GOWN STRL REUS W/TWL XL LVL3 (GOWN DISPOSABLE) ×2
KIT BASIN OR (CUSTOM PROCEDURE TRAY) ×3 IMPLANT
KIT INFUSE LRG II (Orthopedic Implant) ×3 IMPLANT
KIT ROOM TURNOVER OR (KITS) ×3 IMPLANT
MARCAINE  0.5% MPF WITH EPI 1;200000 ×3 IMPLANT
MILL MEDIUM DISP (BLADE) ×3 IMPLANT
NEEDLE HYPO 18GX1.5 BLUNT FILL (NEEDLE) IMPLANT
NEEDLE HYPO 21X1.5 SAFETY (NEEDLE) ×3 IMPLANT
NEEDLE HYPO 25X1 1.5 SAFETY (NEEDLE) IMPLANT
NS IRRIG 1000ML POUR BTL (IV SOLUTION) ×3 IMPLANT
PACK FOAM VITOSS 10CC (Orthopedic Implant) ×3 IMPLANT
PACK LAMINECTOMY NEURO (CUSTOM PROCEDURE TRAY) ×3 IMPLANT
PAD ARMBOARD 7.5X6 YLW CONV (MISCELLANEOUS) ×9 IMPLANT
PATTIES SURGICAL .5 X1 (DISPOSABLE) ×3 IMPLANT
PATTIES SURGICAL .5 X3 (DISPOSABLE) IMPLANT
ROD CREO 45MM SPINAL (Rod) ×6 IMPLANT
SCREW CREO THREADED 5.5X45MM (Screw) ×12 IMPLANT
SPACER RISE 8X22 8-14MM-10 (Neuro Prosthesis/Implant) ×6 IMPLANT
SPONGE LAP 4X18 X RAY DECT (DISPOSABLE) ×3 IMPLANT
SPONGE NEURO XRAY DETECT 1X3 (DISPOSABLE) IMPLANT
SPONGE SURGIFOAM ABS GEL 100 (HEMOSTASIS) ×3 IMPLANT
STAPLER SKIN PROX WIDE 3.9 (STAPLE) ×3 IMPLANT
STRIP CLOSURE SKIN 1/2X4 (GAUZE/BANDAGES/DRESSINGS) ×2 IMPLANT
SUT VIC AB 1 CT1 18XBRD ANBCTR (SUTURE) ×2 IMPLANT
SUT VIC AB 1 CT1 8-18 (SUTURE) ×4
SUT VIC AB 2-0 CP2 18 (SUTURE) ×6 IMPLANT
SUT VIC AB 3-0 SH 8-18 (SUTURE) ×3 IMPLANT
SYR 20CC LL (SYRINGE) ×3 IMPLANT
SYR 20ML ECCENTRIC (SYRINGE) ×3 IMPLANT
SYR 5ML LL (SYRINGE) IMPLANT
TOWEL OR 17X24 6PK STRL BLUE (TOWEL DISPOSABLE) ×3 IMPLANT
TOWEL OR 17X26 10 PK STRL BLUE (TOWEL DISPOSABLE) ×3 IMPLANT
TRAY FOLEY CATH 14FRSI W/METER (CATHETERS) ×3 IMPLANT
WATER STERILE IRR 1000ML POUR (IV SOLUTION) ×3 IMPLANT

## 2014-05-09 NOTE — Anesthesia Procedure Notes (Signed)
Procedure Name: Intubation Date/Time: 05/09/2014 8:51 AM Performed by: Gwenyth AllegraADAMI, Posie Lillibridge Pre-anesthesia Checklist: Emergency Drugs available, Patient identified, Timeout performed, Suction available and Patient being monitored Patient Re-evaluated:Patient Re-evaluated prior to inductionOxygen Delivery Method: Circle system utilized Preoxygenation: Pre-oxygenation with 100% oxygen Intubation Type: IV induction Ventilation: Mask ventilation without difficulty Laryngoscope Size: Mac and 4 Grade View: Grade I Tube type: Oral Tube size: 7.0 mm Number of attempts: 1 Airway Equipment and Method: Stylet Placement Confirmation: ETT inserted through vocal cords under direct vision,  breath sounds checked- equal and bilateral and positive ETCO2 Secured at: 21 cm Tube secured with: Tape Dental Injury: Teeth and Oropharynx as per pre-operative assessment

## 2014-05-09 NOTE — Transfer of Care (Signed)
Immediate Anesthesia Transfer of Care Note  Patient: Nicole Melton  Procedure(s) Performed: Procedure(s): POSTERIOR LUMBAR INTERBODY FUSION LUMBAR THREE-FOUR ,LUMBAR FOUR-FIVE (N/A)  Patient Location: PACU  Anesthesia Type:General  Level of Consciousness: awake, alert  and oriented  Airway & Oxygen Therapy: Patient Spontanous Breathing and Patient connected to nasal cannula oxygen  Post-op Assessment: Report given to PACU RN and Post -op Vital signs reviewed and stable  Post vital signs: Reviewed and stable  Complications: No apparent anesthesia complications

## 2014-05-09 NOTE — Anesthesia Postprocedure Evaluation (Signed)
  Anesthesia Post-op Note  Patient: Nicole Melton  Procedure(s) Performed: Procedure(s): POSTERIOR LUMBAR INTERBODY FUSION LUMBAR THREE-FOUR ,LUMBAR FOUR-FIVE (N/A)  Patient Location: PACU  Anesthesia Type:General  Level of Consciousness: awake, alert , oriented and patient cooperative  Airway and Oxygen Therapy: Patient Spontanous Breathing and Patient connected to nasal cannula oxygen  Post-op Pain: mild  Post-op Assessment: Post-op Vital signs reviewed, Patient's Cardiovascular Status Stable, Respiratory Function Stable, Patent Airway, No signs of Nausea or vomiting and Pain level controlled  Post-op Vital Signs: Reviewed and stable  Last Vitals:  Filed Vitals:   05/09/14 1334  BP: 144/80  Pulse: 73  Temp:   Resp: 16    Complications: No apparent anesthesia complications

## 2014-05-09 NOTE — Progress Notes (Signed)
Pt is admitted to 4N12 from PACU. Admission vitals are stable

## 2014-05-09 NOTE — Op Note (Signed)
NAMGarwin Melton:  Trembath, Teanna              ACCOUNT NO.:  [id number]  MEDICAL RECORD NO.:  [id number]  LOCATION:  [facility]                         FACILITY:  [facility]  PHYSICIAN:  Hilda LiasErnesto Lakevia Perris, M.D.   DATE OF BIRTH:  December 31, 1950  DATE OF PROCEDURE:  05/09/2014 DATE OF DISCHARGE:                              OPERATIVE REPORT   PREOPERATIVE DIAGNOSES:  Degenerative disk disease, L3-L4, L4-5. Chronic radiculopathy.  Status post fusion at L4-5 for spondylolisthesis, facet screws, status poor L3-4 laminectomy.  POSTOPERATIVE DIAGNOSES:  Degenerative disk disease, L3-L4, L4-5. Chronic radiculopathy. Status post fusion at L4-5 for spondylolisthesis, facet screws, status poor L3-4 laminectomy.  PROCEDURE:  Bilaterally redo L3 laminectomy.  Decompression of the thecal sac.  Foraminotomy to decompress the L3, L4, and L5 nerve roots bilaterally.  Bilateral L3-L4 diskectomy more than normal to introduce 2 cages.  Pedicle screws L3-L4.  Posterolateral arthrodesis with autograft, Vitoss and BMP from L3 down to L5.  Cell Saver.  C-arm.  SURGEON:  Hilda LiasErnesto Jannet Calip, M.D.  ASSISTANT:  Dr. Lovell SheehanJenkins.  CLINICAL HISTORY:  This lady many years ago had facet decompression and fusion using the screws at the level L4-L5.  [place]  She came to my office, complaining of back pain, worsened to both lower extremities and at that time, we found that she has a _severe________ stenosis at the L3-4.  Decompression was done and she did well.  Nevertheless lately, she is having worsening of the pain with radiation to both legs, weakening of the right dorsiflexion.  Medication has no any help.  Epidural injection has no help.  Myelogram and x-ray showed that she has had degenerative disk disease at L3-4.  There is some mild spondylolisthesis at L1-L3 which did not move with doing flexion-extension and then facet arthropathy at the level of L4-L5. Surgery was advised.  The patient knew the risk with the  surgery as well as the complications.  DESCRIPTION OF PROCEDURE:  The patient was taken to the OR.  After intubation, she was positioned in a prone manner.  The back was cleaned with Betadine and DuraPrep.  Drapes were applied.  Midline incision following the previous one from L2 down to L4-5 was made and muscles were retracted all the way laterally.  The lysis of adhesion was done. We investigated the area between 4-5 and indeed the area was solid. There was no any mobility, whatsoever.  Because of that, I made a telephone call to the radiologist, Dr. Osa Craverurnef who was kind enough to review the x-ray.  He told me that indeed 4-5 is solid and there is some stenosis at the L4-5 and also facet arthropathy at the level of 3-4, but there was no any abnormal movement between L1, L2, L3.  From then on what I proceeded with was re-doing the laminectomy of L3 with decompression of the thecal sac and decompression of the L3 and L4 nerve root.  Then, with the drill, we removed part of the medial facet of the L4-L5 and I was able to decompress both L5 nerve roots bilaterally. Then identified the L3-L4 first in the right side and then the left side and did a total diskectomy medially and  bilaterally more than normal.  I introduced 2 cages, but they were opening at 8 mm and they were expanded to 13.  The cages had autograft and BMP.  The rest of the disk was filled up with mix of Vitoss, BMP, and autograft.  From then on, using the C-arm in AP and then lateral view, I was able to make 4 holes in the pedicles at L3 and L4 and introduced 4 screws of 5.5 x 45.  Prior to introduction, I was able to feel all 4 quadrants just to be sure that we were surrounded by bone.  The screws were kept in place with a rod and caps.  A cross-link from right to left was done.  Then using the drill, I removed the periosteum of the lateral aspect of 3-4, 4-5 as well as the transverse process.  A mix of BMP, Vitoss, and  autograft was used for the arthrodesis.  The area was irrigated.  Valsalva maneuver was negative.  A drain was left in the epidural space and the wound was closed with different layer of Vicryl and staples.          ______________________________ Hilda LiasErnesto Raschelle Wisenbaker, M.D.     EB/MEDQ  D:  05/09/2014  T:  05/09/2014  Job:  [id number]

## 2014-05-10 LAB — GLUCOSE, CAPILLARY: Glucose-Capillary: 153 mg/dL — ABNORMAL HIGH (ref 70–99)

## 2014-05-10 MED ORDER — OXYCODONE HCL 5 MG PO TABS
15.0000 mg | ORAL_TABLET | ORAL | Status: DC | PRN
  Administered 2014-05-10 – 2014-05-14 (×24): 15 mg via ORAL
  Filled 2014-05-10 (×24): qty 3

## 2014-05-10 NOTE — Evaluation (Signed)
Physical Therapy Evaluation Patient Details Name: Pollyann GlenJanice E Tomb MRN: [id number] DOB: Dec 18, 1950 Today's Date: 05/10/2014   History of Present Illness  63 y.o. female who underwent PLIF 05-09-14.  Clinical Impression  Patient is s/p PLIF surgery resulting in the deficits listed below (see PT Problem List). Min assist needed for bed mobility.  Min guard assist for transfers and gait with RW 75 feet. Patient will benefit from skilled PT to increase their independence and safety with mobility (while adhering to their precautions) to allow discharge home.  Pt has a friend and private CNA that will be assisting her at home.  No follow-up PT services indicated.  She has all needed home equipment.      Follow Up Recommendations No PT follow up;Supervision - Intermittent    Equipment Recommendations  None recommended by PT    Recommendations for Other Services       Precautions / Restrictions Precautions Precautions: Back Precaution Comments: Pt educated on 3/3 precautions.  Handout provided. Required Braces or Orthoses:  (No brace ordered, but pt interested in having lumbar corset.)      Mobility  Bed Mobility Overal bed mobility: Needs Assistance Bed Mobility: Sidelying to Sit   Sidelying to sit: Min assist       General bed mobility comments: verbal/tactile cues for logroll  Transfers Overall transfer level: Needs assistance Equipment used: Rolling walker (2 wheeled) Transfers: Sit to/from Stand Sit to Stand: Min guard         General transfer comment: verbal cues for hand placement  Ambulation/Gait Ambulation/Gait assistance: Min guard Ambulation Distance (Feet): 75 Feet Assistive device: Rolling walker (2 wheeled) Gait Pattern/deviations: Step-through pattern;Decreased stride length Gait velocity: decreased      Stairs            Wheelchair Mobility    Modified Rankin (Stroke Patients Only)       Balance                                             Pertinent Vitals/Pain Pain Assessment: 0-10 Pain Score: 7  Pain Location: back Pain Intervention(s): Monitored during session;Repositioned    Home Living Family/patient expects to be discharged to:: Private residence Living Arrangements: Alone Available Help at Discharge: Friend(s);Available 24 hours/day Type of Home: House Home Access: Stairs to enter Entrance Stairs-Rails: None Entrance Stairs-Number of Steps: 2 Home Layout: One level Home Equipment: Walker - 2 wheels;Bedside commode      Prior Function Level of Independence: Independent               Hand Dominance        Extremity/Trunk Assessment   Upper Extremity Assessment: Defer to OT evaluation           Lower Extremity Assessment: Overall WFL for tasks assessed         Communication   Communication: No difficulties  Cognition Arousal/Alertness: Awake/alert Behavior During Therapy: WFL for tasks assessed/performed Overall Cognitive Status: Within Functional Limits for tasks assessed                      General Comments      Exercises        Assessment/Plan    PT Assessment Patient needs continued PT services  PT Diagnosis Difficulty walking;Acute pain   PT Problem List Decreased activity tolerance;Decreased mobility;Decreased knowledge of precautions;Pain;Decreased knowledge of  use of DME  PT Treatment Interventions DME instruction;Gait training;Stair training;Functional mobility training;Therapeutic activities;Patient/family education;Balance training   PT Goals (Current goals can be found in the Care Plan section) Acute Rehab PT Goals Patient Stated Goal: home PT Goal Formulation: With patient Time For Goal Achievement: 05/17/14 Potential to Achieve Goals: Good    Frequency Min 5X/week   Barriers to discharge        Co-evaluation               End of Session Equipment Utilized During Treatment: Gait belt Activity Tolerance: Patient  tolerated treatment well Patient left: in bed;with call bell/phone within reach Nurse Communication: Mobility status         Time: 1610-96040958-1016 PT Time Calculation (min): 18 min   Charges:   PT Evaluation $Initial PT Evaluation Tier I: 1 Procedure PT Treatments $Gait Training: 8-22 mins   PT G Codes:          Ilda FoilGarrow, Lillyauna Jenkinson Rene 05/10/2014, 10:50 AM

## 2014-05-10 NOTE — Progress Notes (Signed)
UR COMPLETED  

## 2014-05-10 NOTE — Evaluation (Signed)
Occupational Therapy Evaluation Patient Details Name: Nicole Melton MRN: [id number] DOB: [birth year] Today's Date: 05/10/2014    History of Present Illness 63 y.o. female who underwent PLIF 05-09-14.   Clinical Impression   Pt was independent prior to admission.  She is now performing ADL and ADL tranfers with supervision to min guard assist.  Pt has all necessary equipment an 24 hour care at home.  All education completed.  No further OT needs.    Follow Up Recommendations  No OT follow up    Equipment Recommendations  None recommended by OT    Recommendations for Other Services       Precautions / Restrictions Precautions Precautions: Back;Fall Precaution Booklet Issued: Yes (comment) Precaution Comments: Pt educated on 3/3 precautions.  Handout provided. Required Braces or Orthoses:  (No brace ordered, but pt interested in having lumbar corset.)      Mobility Bed Mobility Overal bed mobility: Needs Assistance Bed Mobility: Rolling;Sidelying to Sit;Sit to Sidelying Rolling: Supervision Sidelying to sit: Supervision;HOB elevated     Sit to sidelying: Min guard;HOB elevated General bed mobility comments: heavy reliance on rail, used log roll  Transfers Overall transfer level: Needs assistance Equipment used: Rolling walker (2 wheeled) Transfers: Sit to/from Stand Sit to Stand: Supervision         General transfer comment: verbal cues for hand placement    Balance                                            ADL Overall ADL's : Needs assistance/impaired Eating/Feeding: Independent;Sitting   Grooming: Wash/dry hands;Supervision/safety;Standing   Upper Body Bathing: Set up;Sitting   Lower Body Bathing: Supervison/ safety;Sit to/from stand   Upper Body Dressing : Set up;Sitting   Lower Body Dressing: Supervision/safety;Sit to/from stand   Toilet Transfer: Supervision/safety;Comfort height toilet;Ambulation;Grab bars;RW   Toileting-  Clothing Manipulation and Hygiene: Modified independent;Sit to/from stand Toileting - Clothing Manipulation Details (indicate cue type and reason): instructed to avoid twisting with pericare and flushing     Functional mobility during ADLs: Min guard;Rolling walker General ADL Comments: Pt is able to bring her feet to opposite knee to perform LB ADL.     Vision                     Perception     Praxis      Pertinent Vitals/Pain Pain Assessment: 0-10 Pain Score: 8  Pain Location: back with ambulation Pain Descriptors / Indicators: Aching Pain Intervention(s): Premedicated before session;Repositioned     Hand Dominance Right   Extremity/Trunk Assessment Upper Extremity Assessment Upper Extremity Assessment: Overall WFL for tasks assessed   Lower Extremity Assessment Lower Extremity Assessment: Defer to PT evaluation       Communication Communication Communication: No difficulties   Cognition Arousal/Alertness: Awake/alert Behavior During Therapy: WFL for tasks assessed/performed Overall Cognitive Status: Within Functional Limits for tasks assessed                     General Comments       Exercises       Shoulder Instructions      Home Living Family/patient expects to be discharged to:: Private residence Living Arrangements: Alone Available Help at Discharge: Friend(s);Available 24 hours/day Type of Home: House Home Access: Stairs to enter Entergy CorporationEntrance Stairs-Number of Steps: 2 Entrance Stairs-Rails: None Home Layout: One level  Bathroom Shower/Tub: Producer, television/film/videoWalk-in shower   Bathroom Toilet: Standard     Home Equipment: Environmental consultantWalker - 2 wheels;Bedside commode;Grab bars - toilet (2 BSC), reacher   Additional Comments: Pt is knowledgeble in multiple uses of 3 in 1. and reacher      Prior Functioning/Environment Level of Independence: Independent             OT Diagnosis:     OT Problem List:     OT Treatment/Interventions:      OT  Goals(Current goals can be found in the care plan section) Acute Rehab OT Goals Patient Stated Goal: home  OT Frequency:     Barriers to D/C:            Co-evaluation              End of Session Equipment Utilized During Treatment: Gait belt;Rolling walker  Activity Tolerance: Patient limited by pain Patient left: in bed;with call bell/phone within reach;with bed alarm set   Time: 1335-1407 OT Time Calculation (min): 32 min Charges:  OT General Charges $OT Visit: 1 Procedure OT Evaluation $Initial OT Evaluation Tier I: 1 Procedure OT Treatments $Self Care/Home Management : 8-22 mins G-Codes:    Evern BioMayberry, Ihan Pat Lynn 05/10/2014, 2:09 PM  [phone]

## 2014-05-10 NOTE — Clinical Social Work Note (Signed)
Clinical Social Worker met with patient at bedside in reference to post-acute placement for SNF. Pt reported she will have assistance at home from several family members and friends. CSW notified RNCM. No PT follow up recommended. Per PT pt will return home without home health services.   Clinical Social Worker will sign off for now as social work intervention is no longer needed. Please consult [place] again if new need arises.  Glendon Axe, MSW, LCSWA [phone] 05/10/2014 12:26 PM

## 2014-05-10 NOTE — Progress Notes (Signed)
Patient ID: Nicole Melton, female   DOB: Dec 14, 1950, 63 y.o.   MRN: [id number] Doing well. More strength in right leg. Off iv analgesics

## 2014-05-11 MED ORDER — FLEET ENEMA 7-19 GM/118ML RE ENEM
1.0000 | ENEMA | Freq: Every day | RECTAL | Status: DC | PRN
  Administered 2014-05-11: 1 via RECTAL
  Filled 2014-05-11: qty 1

## 2014-05-11 MED ORDER — TAMSULOSIN HCL 0.4 MG PO CAPS
0.8000 mg | ORAL_CAPSULE | Freq: Every day | ORAL | Status: DC
  Administered 2014-05-11 – 2014-05-14 (×4): 0.8 mg via ORAL
  Filled 2014-05-11 (×4): qty 2

## 2014-05-11 MED ORDER — POLYETHYLENE GLYCOL 3350 17 G PO PACK
17.0000 g | PACK | Freq: Every day | ORAL | Status: DC
  Administered 2014-05-11 – 2014-05-14 (×4): 17 g via ORAL
  Filled 2014-05-11 (×4): qty 1

## 2014-05-11 MED FILL — Sodium Chloride IV Soln 0.9%: INTRAVENOUS | Qty: 1000 | Status: AC

## 2014-05-11 MED FILL — Heparin Sodium (Porcine) Inj 1000 Unit/ML: INTRAMUSCULAR | Qty: 30 | Status: AC

## 2014-05-11 NOTE — Progress Notes (Signed)
Nutrition Brief Note  Malnutrition Screening Tool result is inaccurate. Pt denied weight loss and poor appetite. She is eating 100% of most meals per nursing notes and her weight is stable.  Please consult if nutrition needs are identified.  Ian Malkineanne Barnett RD, LDN Inpatient Clinical Dietitian Pager: [phone] After Hours Pager: [phone]

## 2014-05-11 NOTE — Progress Notes (Signed)
Physical Therapy Treatment Patient Details Name: Nicole Melton MRN: [id number] DOB: 16-Feb-1951 Today's Date: 05/11/2014    History of Present Illness 63 y.o. female who underwent PLIF 05-09-14.    PT Comments    Patient slowly progressing towards goals. Pt with difficulty with bed mobility without use of rails and HOB flat. Requires Mod A for stair negotiation as pt plans to enter home through the garage where there are no rails. Increased pain today. Able to recall 2/3 back precautions. Encouraged OOB as much as tolerated and sitting in chair for all meals. Will need to provide family education on safe stair negotiation technique prior to discharge.   Follow Up Recommendations  No PT follow up;Supervision - Intermittent     Equipment Recommendations  None recommended by PT    Recommendations for Other Services       Precautions / Restrictions Precautions Precautions: Back;Fall Precaution Booklet Issued: Yes (comment) Precaution Comments: Pt educated on 3/3 precautions.  Handout provided. Restrictions Weight Bearing Restrictions: No    Mobility  Bed Mobility Overal bed mobility: Needs Assistance Bed Mobility: Rolling;Sidelying to Sit;Sit to Sidelying Rolling: Supervision Sidelying to sit: Min assist     Sit to sidelying: Min guard General bed mobility comments: HOB flat, no use of rail to simulate home environment. Reviewed log roll technique.  Transfers Overall transfer level: Needs assistance Equipment used: Rolling walker (2 wheeled) Transfers: Sit to/from Stand Sit to Stand: Supervision         General transfer comment: VC's for hand placement. Stood from [employer] GroupEOb x2, from toilet x1.  Ambulation/Gait Ambulation/Gait assistance: Min guard Ambulation Distance (Feet): 120 Feet Assistive device: Rolling walker (2 wheeled) Gait Pattern/deviations: Step-through pattern;Decreased stride length Gait velocity: decreased   General Gait Details: Pt with short small  steps with increased knee flexion bilaterally. Multiple short standing rest breaks due to pain. VC for upright posture.    Stairs Stairs: Yes Stairs assistance: Mod assist Stair Management: No rails;Step to pattern Number of Stairs: 2 General stair comments: Therapist providing hand held assist for support to ascend/descend steps. 1 LOB requiring Mod A to prevent fall. VC's for sequencing and technique.  Wheelchair Mobility    Modified Rankin (Stroke Patients Only)       Balance Overall balance assessment: Needs assistance   Sitting balance-Leahy Scale: Good     Standing balance support: During functional activity Standing balance-Leahy Scale: Poor Standing balance comment: Requires BUE support on RW for safety/balance and pain control.                    Cognition Arousal/Alertness: Awake/alert Behavior During Therapy: WFL for tasks assessed/performed Overall Cognitive Status: Within Functional Limits for tasks assessed       Memory: Decreased recall of precautions (Able to recall 2/3 precautions.)              Exercises      General Comments General comments (skin integrity, edema, etc.): Pt with weepage/draingage through back bandage. mild HA reported since yesterday.       Pertinent Vitals/Pain Pain Assessment: 0-10 Pain Score: 9  Pain Location: back with mobility Pain Descriptors / Indicators: Aching;Sore Pain Intervention(s): Limited activity within patient's tolerance;Monitored during session;Repositioned    Home Living                      Prior Function            PT Goals (current goals can now be found in  the care plan section) Progress towards PT goals: Progressing toward goals    Frequency  Min 5X/week    PT Plan Current plan remains appropriate    Co-evaluation             End of Session Equipment Utilized During Treatment: Gait belt Activity Tolerance: Patient limited by pain Patient left: in bed;with call  bell/phone within reach;with bed alarm set     Time: 1030-1055 PT Time Calculation (min): 25 min  Charges:  $Gait Training: 8-22 mins $Therapeutic Activity: 8-22 mins                    G CodesAlvie Heidelberg:      Folan, Tito Ausmus A 05/11/2014, 11:06 AM Alvie HeidelbergShauna Folan, PT, DPT [phone]

## 2014-05-11 NOTE — Progress Notes (Signed)
Patient ID: Nicole GlenJanice E Melton, female   DOB: [birth year], 63 y.o.   MRN: [id number] Pain better, unable to void. I/o catheter. Flatus positive.

## 2014-05-12 MED ORDER — MANAGING BACK PAIN BOOK
Freq: Once | Status: AC
  Administered 2014-05-12: 07:00:00
  Filled 2014-05-12: qty 1

## 2014-05-12 MED ORDER — NICOTINE 21 MG/24HR TD PT24
21.0000 mg | MEDICATED_PATCH | Freq: Every day | TRANSDERMAL | Status: DC
  Filled 2014-05-12: qty 1

## 2014-05-12 NOTE — Progress Notes (Signed)
Patient states she is doing better today. She complains of appropriate but significant back soreness. Denies leg pain or numbness or tingling but describes a vague leg weakness. seemsTo have good strength to an in bed exam. She is happy and pleasant and conversant. She is eating well.she would like to go home tomorrow or the next day and continue pain control here for now.

## 2014-05-12 NOTE — Progress Notes (Signed)
Physical Therapy Treatment Patient Details Name: Nicole Melton MRN: [id number] DOB: September 30, 1950 Today's Date: 05/12/2014    History of Present Illness 63 y.o. female who underwent PLIF 05-09-14.    PT Comments    Patient limited by pain but determined to practice steps. Patient stated she had rails on steps at home. Please reaffirm that she actually does next session. Needs to attempt stairs again prior to DC home. Plan is for home Monday  Follow Up Recommendations  No PT follow up;Supervision - Intermittent     Equipment Recommendations  None recommended by PT    Recommendations for Other Services       Precautions / Restrictions Precautions Precautions: Back;Fall Precaution Comments: Patient able to recall 3/3 precautions    Mobility  Bed Mobility     Rolling: Supervision Sidelying to sit: Supervision       General bed mobility comments: HOB flat, no use of rail to simulate home environment. Reviewed log roll technique. Increased effort required  Transfers Overall transfer level: Needs assistance Equipment used: Rolling walker (2 wheeled)   Sit to Stand: Supervision         General transfer comment: VC's for hand placement.   Ambulation/Gait Ambulation/Gait assistance: Supervision Ambulation Distance (Feet): 100 Feet Assistive device: Rolling walker (2 wheeled) Gait Pattern/deviations: Step-through pattern;Decreased stride length Gait velocity: decreased   General Gait Details: Pt with short small steps with increased knee flexion bilaterally. Seated rest break due to pain   Stairs   Stairs assistance: Min guard Stair Management: Step to pattern;Two rails Number of Stairs: 3 General stair comments: Patient stated she had steps in front with railing. May need to ensure that she does next session  Wheelchair Mobility    Modified Rankin (Stroke Patients Only)       Balance                                    Cognition  Arousal/Alertness: Awake/alert Behavior During Therapy: WFL for tasks assessed/performed Overall Cognitive Status: Within Functional Limits for tasks assessed                      Exercises      General Comments        Pertinent Vitals/Pain Pain Score: 10-Worst pain ever Pain Location: back Pain Descriptors / Indicators: Aching;Sore Pain Intervention(s): Patient requesting pain meds-RN notified    Home Living                      Prior Function            PT Goals (current goals can now be found in the care plan section) Progress towards PT goals: Progressing toward goals    Frequency  Min 5X/week    PT Plan Current plan remains appropriate    Co-evaluation             End of Session Equipment Utilized During Treatment: Gait belt Activity Tolerance: Patient limited by pain Patient left: in chair;with call bell/phone within reach     Time: 1049-1107 PT Time Calculation (min): 18 min  Charges:  $Gait Training: 8-22 mins                    G Codes:      Fredrich BirksRobinette, Nicole Melton 05/12/2014, 12:04 PM 05/12/2014 Fredrich Birksobinette, Nicole Melton PTA [phone] pager [phone] office

## 2014-05-13 NOTE — Progress Notes (Signed)
Overall progressing reasonably well. Still limited by back pain. Lower extremity pain very much improved. Does not feel strong enough to consider home discharge but feels that she'll likely be ready tomorrow. No new questions or concerns.  Afebrile. Vitals are stable. Wound clean and dry. Chest and abdomen benign.  Progressing slowly but steadily. Continue efforts at rehabilitation. Possible discharge tomorrow.

## 2014-05-13 NOTE — Progress Notes (Signed)
Patient cancelled/declined therapy this afternoon as she recently amb in hall using RW with nurse.  This PT observed patient amb in hall. Patient reports she may want to practice steps in am prior to discharge home. Nestor LewandowskyKristen M Crista Nuon, PT [phone]

## 2014-05-14 NOTE — Progress Notes (Signed)
PT Cancellation Note  Patient Details Name: Nicole Melton MRN: [id number] DOB: [birth year]   Cancelled Treatment:    Reason Eval/Treat Not Completed: Patient declined, no reason specified. Patient politely declining PT this AM, stated that she felt comfortable with steps and that her friends were able to walk with her yesterday. Patient stated that she has a lot of walking to get into the house a prefers to save her energy.    Fredrich BirksRobinette, [name] [name] 05/14/2014, 9:55 AM

## 2014-05-14 NOTE — Discharge Summary (Signed)
Physician Discharge Summary  Patient ID: Pollyann GlenJanice E Porco MRN: [id number] DOB/AGE: [birth year] 63 y.o.  Admit date: 05/09/2014 Discharge date: 05/14/2014  Admission Diagnoses:lumbar degenerative disc disease  Discharge Diagnoses:  Active Problems:   Lumbar degenerative disc disease   Discharged Condition: no weakness  Hospital Course:surgery  Consults: none  Significant Diagnostic Studies: myelogram  Treatments: lumbar fusion  Discharge Exam: Blood pressure 108/62, pulse 85, temperature 98.8 F (37.1 C), temperature source Oral, resp. rate 18, height 5\' 8"  (1.727 m), weight 94.348 kg (208 lb), SpO2 97 %. Walking, no weakness  Disposition  home    Medication List    ASK your doctor about these medications        cetirizine 5 MG tablet  Commonly known as:  ZYRTEC  Take 5 mg by mouth daily.     ibuprofen 200 MG tablet  Commonly known as:  ADVIL,MOTRIN  Take 400 mg by mouth daily as needed. For pain.     metoprolol succinate 25 MG 24 hr tablet  Commonly known as:  TOPROL-XL  Take 25 mg by mouth daily.         Signed: Karn CassisBOTERO,Shavonte Zhao M 05/14/2014, 9:16 AM

## 2014-05-14 NOTE — Progress Notes (Signed)
D/C orders received. Pt educated on d/c instructions and spinal precautions. Pt verbalized understanding. Pt handed d/c packet. IV removed. Pt taken downstairs by staff via wheelchair.

## 2016-06-04 ENCOUNTER — Other Ambulatory Visit: Payer: Self-pay | Admitting: Neurosurgery

## 2016-06-04 DIAGNOSIS — M5136 Other intervertebral disc degeneration, lumbar region: Secondary | ICD-10-CM

## 2016-08-04 ENCOUNTER — Ambulatory Visit
Admission: RE | Admit: 2016-08-04 | Discharge: 2016-08-04 | Disposition: A | Discharge status: Home / Self Care | Payer: Medicare Other | Source: Ambulatory Visit | Attending: Neurosurgery | Admitting: Neurosurgery

## 2016-08-04 VITALS — BP 112/70 | HR 78

## 2016-08-04 DIAGNOSIS — M5136 Other intervertebral disc degeneration, lumbar region: Secondary | ICD-10-CM

## 2016-08-04 MED ORDER — ONDANSETRON HCL 4 MG/2ML IJ SOLN: 4.0000 mg | Freq: Four times a day (QID) | INTRAMUSCULAR | Status: DC | PRN

## 2016-08-04 MED ORDER — IOPAMIDOL (ISOVUE-M 200) INJECTION 41%
15.0000 mL | Freq: Once | INTRAMUSCULAR | Status: AC
  Administered 2016-08-04: 15 mL via INTRATHECAL

## 2016-08-04 MED ORDER — HYDROXYZINE HCL 50 MG/ML IM SOLN
25.0000 mg | Freq: Once | INTRAMUSCULAR | Status: AC
  Administered 2016-08-04: 25 mg via INTRAMUSCULAR

## 2016-08-04 MED ORDER — DIAZEPAM 5 MG PO TABS
5.0000 mg | ORAL_TABLET | Freq: Once | ORAL | Status: AC
  Administered 2016-08-04: 10 mg via ORAL

## 2016-08-04 MED ORDER — MEPERIDINE HCL 100 MG/ML IJ SOLN
75.0000 mg | Freq: Once | INTRAMUSCULAR | Status: AC
  Administered 2016-08-04: 75 mg via INTRAMUSCULAR

## 2016-08-04 NOTE — Discharge Instructions (Signed)

## 2017-08-10 IMAGING — CT CT L SPINE W/ CM
1 of 6 series · 5 of 14 positions shown, 7 images · non-contrast
Comparison: Radiography 05/25/2016

CLINICAL DATA: Previous lumbar fusion. Back pain with right leg
pain radiating to the foot, more to the great toe.
TECHNIQUE: Contiguous axial images were obtained through the Lumbar spine after
the intrathecal infusion of infusion. Coronal and sagittal
reconstructions were obtained of the axial image sets.

[Series 2: l spine soft (person_name) · axial · 0.27mm/px · z∈[-278,-134]mm · 5 of 73 slices shown, 7 images]
[im 13/73  soft-tissue]
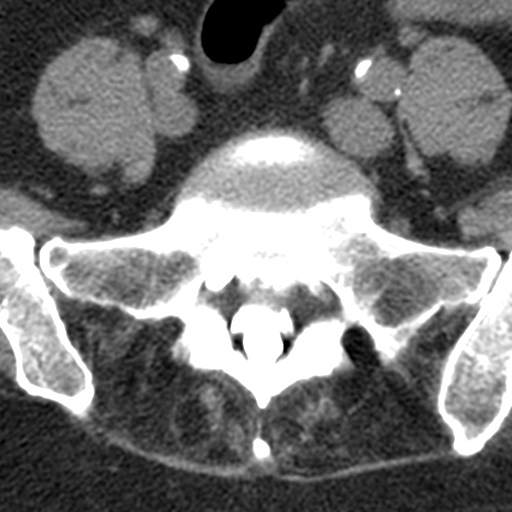
[im 13/73  bone]
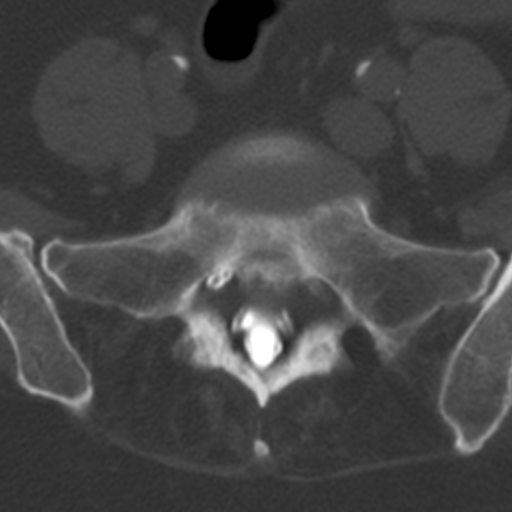
[im 25/73  bone]
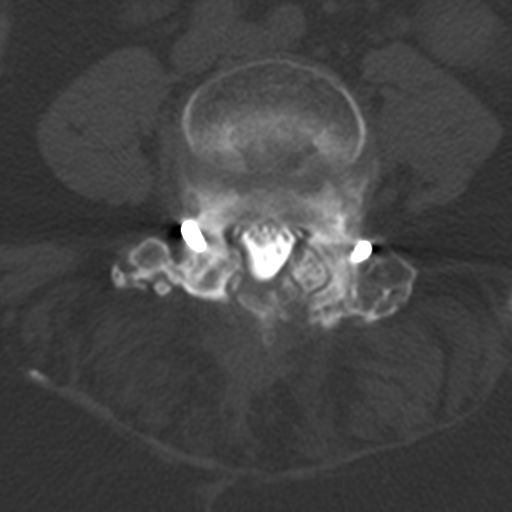
[im 37/73  bone]
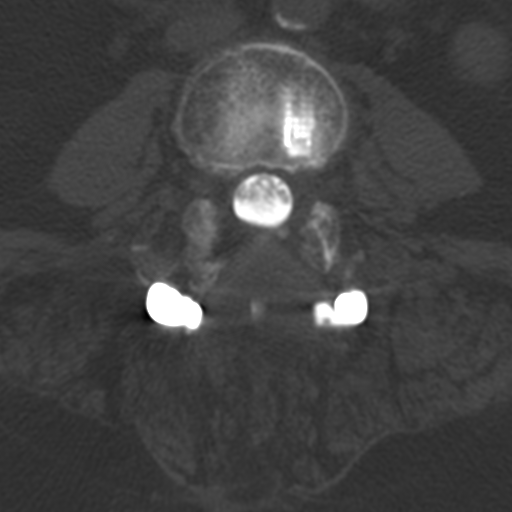
[im 49/73  bone]
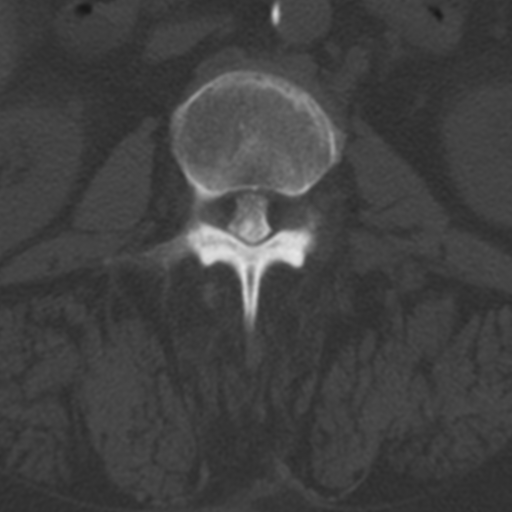
[im 61/73  soft-tissue]
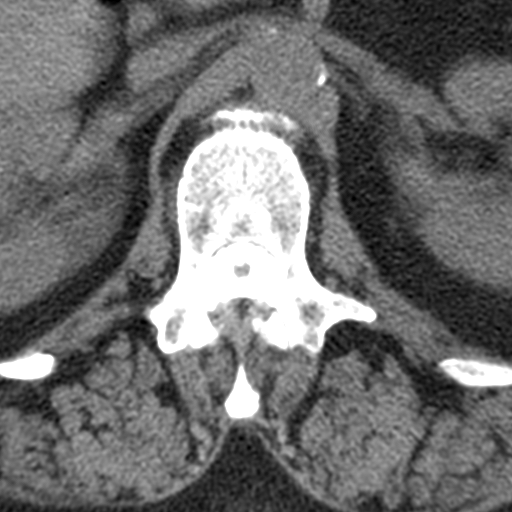
[im 61/73  bone]
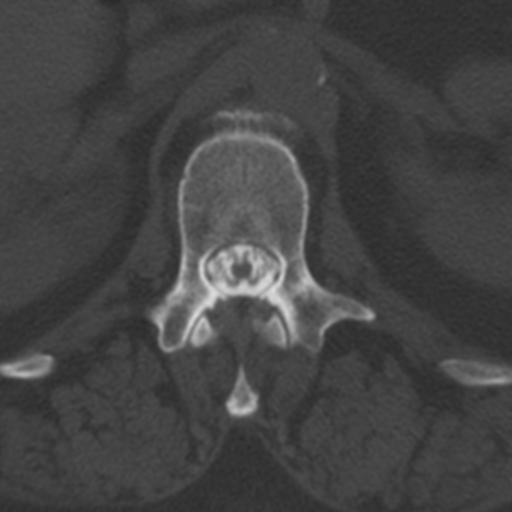

[5 of 14 positions shown; findings below may reference images not displayed]

EXAM:
LUMBAR MYELOGRAM

FLUOROSCOPY TIME:  1 minutes 3 seconds. 248.00 micro gray meter
squared

PROCEDURE:
After thorough discussion of risks and benefits of the procedure
including bleeding, infection, injury to nerves, blood vessels,
adjacent structures as well as headache and CSF leak, written and
oral informed consent was obtained. Consent was obtained by Dr. Kiran
Mohabatullah. Time out form was completed.

Patient was positioned prone on the fluoroscopy table. Local
anesthesia was provided with 1% lidocaine without epinephrine after
prepped and draped in the usual sterile fashion. Puncture was
performed at L5-S1 using a 3 1/2 inch 22-gauge spinal needle via
right para median approach. Using a single pass through the dura,
the needle was placed within the thecal sac, with return of clear
CSF. 15 mL of Isovue 200 was injected into the thecal sac, with
normal opacification of the nerve roots and cauda equina consistent
with free flow within the subarachnoid space.

I personally performed the lumbar puncture and administered the
intrathecal contrast. I also personally performed acquisition of the
myelogram images.
FINDINGS: LUMBAR MYELOGRAM FINDINGS:

Previous discectomy and fusion at L3-4. Previous facet screws at
L4-5. Sufficient patency of the central canal at those levels,
despite 12 mm of anterolisthesis at the L4-5 level. Anterior
extradural defects at L1-2 and L2-3 with retrolisthesis at those
levels. Spinal stenosis, more severe at L2-3 than L1-2. Neural
compression could occur her at either of those levels.

Standing lateral flexion extension views do not show additional
motion.

CT LUMBAR MYELOGRAM FINDINGS:

T12-L1:  Minimal bulging of the disc.  No stenosis.

L1-2: Retrolisthesis of 5 mm. Disc degeneration with bulging of the
disc. Narrowing of both lateral recesses and neural foramina that
could cause neural compression on either side.

L2-3: Retrolisthesis of 5 mm. Disc degeneration with circumferential
protrusion of the disc. Facet and ligamentous hypertrophy.
Moderately severe stenosis of the canal, lateral recesses and neural
foramina. Neural compression could occur on either side.

L3-4: Previous posterior decompression, diskectomy and fusion.
Fusion is solid. Wide patency of the canal and foramina.

L4-5: Anterolisthesis of 1 cm. Chronic facet arthropathy with trans
facet screws. Bulging of the disc. Vacuum phenomenon within the
disc. There is probably slight ongoing motion at this level. No
neural compression in the central canal. Neural foramina are
narrowed bilaterally with the L4 nerve roots are not visibly
compressed.

L5-S1: Mild bulging of the disc. Mild facet osteoarthritis. No
stenosis or neural compression.

Ordinary sacroiliac osteoarthritis.
IMPRESSION: L1-2: Retrolisthesis of 5 mm. Disc degeneration with bulging of the
disc. Stenosis of the lateral recesses and neural foramina that
could cause neural compression.

L2-3: Retrolisthesis of 5 mm. Disc degeneration with circumferential
protrusion of the disc. Facet and ligamentous hypertrophy. Stenosis
of the canal, lateral recesses and neural foramina that could cause
neural compression.

L3-4: Previous posterior decompression, diskectomy and fusion.
Fusion is solid. No stenosis.

L4-5: Previous trans facet screws. 1 cm anterolisthesis. No central
canal stenosis. Foraminal narrowing without visible compression of
the exiting L4 nerve roots. Nitrogen gas in the disc. There may
still be some motion at this level.

Mild non-compressive degenerative changes at L5-S1.

Sacroiliac osteoarthritis.
